# Patient Record
Sex: Male | Born: 1955
Health system: Southern US, Community
[De-identification: ages and names within clinical notes are randomized; demographics above are authoritative.]

## PROBLEM LIST (undated history)

## (undated) DIAGNOSIS — G8929 Other chronic pain: Secondary | ICD-10-CM

## (undated) DIAGNOSIS — I2510 Atherosclerotic heart disease of native coronary artery without angina pectoris: Secondary | ICD-10-CM

## (undated) DIAGNOSIS — G43909 Migraine, unspecified, not intractable, without status migrainosus: Secondary | ICD-10-CM

## (undated) DIAGNOSIS — E119 Type 2 diabetes mellitus without complications: Secondary | ICD-10-CM

## (undated) DIAGNOSIS — I482 Chronic atrial fibrillation, unspecified: Secondary | ICD-10-CM

## (undated) DIAGNOSIS — G4730 Sleep apnea, unspecified: Secondary | ICD-10-CM

## (undated) DIAGNOSIS — D72829 Elevated white blood cell count, unspecified: Secondary | ICD-10-CM

## (undated) DIAGNOSIS — R413 Other amnesia: Secondary | ICD-10-CM

## (undated) DIAGNOSIS — I10 Essential (primary) hypertension: Secondary | ICD-10-CM

## (undated) DIAGNOSIS — F419 Anxiety disorder, unspecified: Secondary | ICD-10-CM

## (undated) DIAGNOSIS — F32A Depression, unspecified: Secondary | ICD-10-CM

## (undated) DIAGNOSIS — I700 Atherosclerosis of aorta: Secondary | ICD-10-CM

## (undated) DIAGNOSIS — F329 Major depressive disorder, single episode, unspecified: Secondary | ICD-10-CM

## (undated) DIAGNOSIS — K449 Diaphragmatic hernia without obstruction or gangrene: Secondary | ICD-10-CM

## (undated) DIAGNOSIS — G629 Polyneuropathy, unspecified: Secondary | ICD-10-CM

## (undated) DIAGNOSIS — K219 Gastro-esophageal reflux disease without esophagitis: Secondary | ICD-10-CM

## (undated) DIAGNOSIS — D126 Benign neoplasm of colon, unspecified: Secondary | ICD-10-CM

## (undated) DIAGNOSIS — I2584 Coronary atherosclerosis due to calcified coronary lesion: Secondary | ICD-10-CM

## (undated) DIAGNOSIS — M549 Dorsalgia, unspecified: Secondary | ICD-10-CM

## (undated) DIAGNOSIS — A048 Other specified bacterial intestinal infections: Secondary | ICD-10-CM

## (undated) HISTORY — DX: Chronic atrial fibrillation, unspecified: I48.20

## (undated) HISTORY — DX: Type 2 diabetes mellitus without complications: E11.9

## (undated) HISTORY — DX: Other chronic pain: G89.29

## (undated) HISTORY — DX: Atherosclerotic heart disease of native coronary artery without angina pectoris: I25.10

## (undated) HISTORY — DX: Depression, unspecified: F32.A

## (undated) HISTORY — DX: Other amnesia: R41.3

## (undated) HISTORY — DX: Gastro-esophageal reflux disease without esophagitis: K21.9

## (undated) HISTORY — DX: Other specified bacterial intestinal infections: A04.8

## (undated) HISTORY — DX: Anxiety disorder, unspecified: F41.9

## (undated) HISTORY — DX: Diaphragmatic hernia without obstruction or gangrene: K44.9

## (undated) HISTORY — DX: Atherosclerosis of aorta: I70.0

## (undated) HISTORY — DX: Elevated white blood cell count, unspecified: D72.829

## (undated) HISTORY — DX: Coronary atherosclerosis due to calcified coronary lesion: I25.84

## (undated) HISTORY — DX: Major depressive disorder, single episode, unspecified: F32.9

## (undated) HISTORY — DX: Essential (primary) hypertension: I10

## (undated) HISTORY — DX: Benign neoplasm of colon, unspecified: D12.6

## (undated) HISTORY — DX: Migraine, unspecified, not intractable, without status migrainosus: G43.909

## (undated) HISTORY — DX: Dorsalgia, unspecified: M54.9

---

## 2004-02-27 HISTORY — PX: UMBILICAL HERNIA REPAIR: SHX196

## 2004-02-27 HISTORY — PX: CHOLECYSTECTOMY: SHX55

## 2004-07-18 ENCOUNTER — Emergency Department (HOSPITAL_COMMUNITY): Admission: EM | Admit: 2004-07-18 | Discharge: 2004-07-19 | Payer: Self-pay | Admitting: Family Medicine

## 2004-07-25 ENCOUNTER — Observation Stay (HOSPITAL_COMMUNITY): Admission: RE | Admit: 2004-07-25 | Discharge: 2004-07-26 | Payer: Self-pay | Admitting: General Surgery

## 2007-03-22 ENCOUNTER — Emergency Department (HOSPITAL_COMMUNITY): Admission: EM | Admit: 2007-03-22 | Discharge: 2007-03-23 | Payer: Self-pay | Admitting: Emergency Medicine

## 2010-04-13 ENCOUNTER — Encounter (INDEPENDENT_AMBULATORY_CARE_PROVIDER_SITE_OTHER): Payer: Self-pay | Admitting: *Deleted

## 2010-04-19 NOTE — Letter (Signed)
Summary: Unable to Reach, Consult Scheduled  [facility]  [address]   [place], [place] [address]   Phone: [phone]  Fax: [phone]    04/13/2010  WESTYN DRIGGERS [address] [place], [place]  [address] 11/23/1955   Dear Mr. Enns,    At the recommendation of DR [facility] we have been asked to schedule you a consult with OUR PRACTICE for CONSULT FOR COLONOSCOPY.  Please call our office at [phone].     Thank you,    Diana Eves  [facility] R. Roetta Sessions, M.D.    Jonette Eva, M.D. Lorenza Burton, FNP-BC    Tana Coast, PA-C Phone: [phone]    Fax: [phone]

## 2010-05-09 ENCOUNTER — Encounter: Payer: Self-pay | Admitting: Gastroenterology

## 2010-05-09 ENCOUNTER — Encounter: Payer: Self-pay | Admitting: Internal Medicine

## 2010-05-09 ENCOUNTER — Ambulatory Visit (INDEPENDENT_AMBULATORY_CARE_PROVIDER_SITE_OTHER): Payer: MEDICARE | Admitting: Gastroenterology

## 2010-05-09 DIAGNOSIS — Z1211 Encounter for screening for malignant neoplasm of colon: Secondary | ICD-10-CM

## 2010-05-09 DIAGNOSIS — R1012 Left upper quadrant pain: Secondary | ICD-10-CM

## 2010-05-15 ENCOUNTER — Ambulatory Visit (HOSPITAL_COMMUNITY)
Admission: RE | Admit: 2010-05-15 | Discharge: 2010-05-15 | Disposition: A | Discharge status: Home / Self Care | Payer: Medicare Other | Source: Ambulatory Visit | Attending: Internal Medicine | Admitting: Internal Medicine

## 2010-05-15 ENCOUNTER — Encounter: Payer: Medicare Other | Admitting: Internal Medicine

## 2010-05-15 ENCOUNTER — Other Ambulatory Visit: Payer: Self-pay | Admitting: Internal Medicine

## 2010-05-15 DIAGNOSIS — D126 Benign neoplasm of colon, unspecified: Secondary | ICD-10-CM

## 2010-05-15 DIAGNOSIS — K921 Melena: Secondary | ICD-10-CM | POA: Insufficient documentation

## 2010-05-15 HISTORY — PX: COLONOSCOPY: SHX174

## 2010-05-16 NOTE — Letter (Signed)
Summary: TCS/EGD/ED ORDER  TCS/EGD/ED ORDER   Imported By: Ave Filter 05/09/2010 09:50:59  _____________________________________________________________________  External Attachment:    Type:   Image     Comment:   External Document

## 2010-05-16 NOTE — Medication Information (Signed)
Summary: OMEPRAZOLE  OMEPRAZOLE   Imported By: Rexene Alberts 05/09/2010 12:22:44  _____________________________________________________________________  External Attachment:    Type:   Image     Comment:   External Document  Appended Document: OMEPRAZOLE addressed at ov today

## 2010-05-16 NOTE — Assessment & Plan Note (Signed)
Summary: CONSULT FOR TCS/HEMORRHOIDS/LAW   Vital Signs:  Patient profile:   55 year old male Height:      68 inches Weight:      214 pounds BMI:     32.66 Temp:     98 degrees F oral Pulse rate:   68 / minute BP sitting:   156 / 108  (left arm)  Vitals Entered By: Carolan Clines LPN (May 09, 2010 8:40 AM)  Visit Type:  Initial Consult Referring Provider:  Dr. Sherwood Gambler Primary Care Provider:  Dr. Sherwood Gambler   History of Present Illness: Garrett Clements is a 55 year old Caucasian male who presents for an initial screening colonoscopy at the request of Dr. Sherwood Gambler. Reports scant amounts of tissue hematochezia. Has a BM twice/day.  Reports LUQ discomfort, sharp, like a sledgehammer X 2-3 years, lasts about 15-20 minutes, not associated with eating/drinking. No aggravating/alleviating factors. Used to take Nexium in the remote past for reflux. Rare episodes of reflux now. No dysphagia/odynophagia. Takes Ibuprofen/Aleve about twice/day for headaches.  Appetite waxes and wanes. No significant wt loss.   Current Medications (verified): 1)  Prilosec 20 Mg Cpdr (Omeprazole) .... Take 1 By Mouth 30 Minutes Before Breakfast 2)  Alprazolam 0.5 Mg Tabs (Alprazolam) .Marland Kitchen.. 1 Tab By Mouth Qid As Needed 3)  Fluorometholone 0.1 % Susp (Fluorometholone) .Marland Kitchen.. 1 Drop Both Eyes Three Times/day X 2 Weeks 4)  Mobic 7.5 Mg Tabs (Meloxicam) .Marland Kitchen.. 1 By Mouth Two Times A Day As Needed  Allergies (verified): No Known Drug Allergies  Past History:  Past Medical History: GERD Chronic migraines Chronic back pain  Past Surgical History: umbilical hernia repair Cholecystectomy   Family History: Mother:living, COPD, glaucoma, DM Father: living, PUD No FH of Colon Cancer:  Social History: Occupation: disability Single, stays with mom,  2 adult children Patient currently smokes. 2-3 ppd X >40 years Alcohol Use - yes, occasionally Illicit Drug Use - no Smoking Status:  current Drug Use:  no  Review of  Systems General:  Denies fever, chills, and anorexia. Eyes:  Denies blurring, irritation, and discharge. ENT:  Denies sore throat, hoarseness, and difficulty swallowing. CV:  Denies chest pains and syncope. Resp:  Denies dyspnea at rest and wheezing. GI:  See HPI. GU:  Denies urinary burning and urinary frequency. MS:  Denies joint pain / LOM, joint swelling, and joint stiffness. Derm:  Denies rash, itching, and dry skin. Neuro:  Denies weakness and syncope. Psych:  Denies depression and anxiety. Endo:  Denies cold intolerance and heat intolerance.  Physical Exam  General:  Well developed, well nourished, no acute distress, yet somewhat anxious regarding upcoming procedure Head:  Normocephalic and atraumatic. Eyes:  sclera without icterus Mouth:  No deformity or lesions, dentition normal. Lungs:  Clear throughout to auscultation. diminished bases Heart:  S1 S2 present Abdomen:  +BS, soft, mildly TTP LUQ, non-distended. no rebound or guarding. No HSM. umbilical hernia scars well-healed.  Msk:  Symmetrical with no gross deformities. Normal posture. Extremities:  No clubbing, cyanosis, edema or deformities noted. Neurologic:  Alert and  oriented x4;  grossly normal neurologically. Skin:  Intact without significant lesions or rashes. Psych:  Alert and cooperative. Normal mood and affect.   Impression & Recommendations:  Problem # 1:  SCREENING COLORECTAL-CANCER (ICD-V21.41)  55 year old Caucasian male with no prior hx of colonoscopy. BM twice daily, scant amount of paper hematochezia noted. Likely due to benign anorectal source. No FH of colon ca. Likelihood of occult malignancy low. Due for screening colonoscopy.  TCS +/- EGD with Dr. Jena Gauss in near future: the R/B/A have been discussed in detail with pt; states understanding and desires to proceed. (EGD may be performed if no improvement with PPI started at today's visit, secondary to LUQ pain, hx of NSAID use. see # 2).    Orders: Consultation Level III ([address])  Problem # 2:  ABDOMINAL PAIN-LUQ (ICD-789.02)  2-3 year hx of intermittent stabbing LUQ pain, no aggravating or alleviating factors. Associated NSAID use, normally daily for chronic headaches. No dysphagia or odynophagia. Pt currently not on PPI. Likely component of gastritis, PUD.    +/- EGD at time of colonoscopy. Prilosec 20 mg by mouth daily, #12 samples given, rx sent to pharmacy  Orders: Consultation Level III (60454)  Patient Instructions: 1)  Take Prilosec 1 capsule 30 minutes before breakfast daily 2)  You will be set up for a procedure with Dr. Jena Gauss to look at your colon. We may need to look at your esophagus/stomach if you continue to have pain in your abdomen 3)  Follow-up with your primary doctor about your blood pressure. 4)  The medication list was reviewed and reconciled.  All changed / newly prescribed medications were explained.  A complete medication list was provided to the patient / caregiver.  Prescriptions: PRILOSEC 20 MG CPDR (OMEPRAZOLE) take 1 by mouth 30 minutes before breakfast  #31 x 3   Entered and Authorized by:   Gerrit Halls NP   Signed by:   Gerrit Halls NP on 05/09/2010   Method used:   Faxed to ...       [facility]  [address] [phone]* (retail)       [address]       [place], [place]  [address]       Ph: [phone]       Fax: [phone]   RxID:   [id number]    Orders Added: 1)  Consultation Level III [36644]

## 2010-05-23 DIAGNOSIS — D126 Benign neoplasm of colon, unspecified: Secondary | ICD-10-CM | POA: Insufficient documentation

## 2010-05-23 HISTORY — DX: Benign neoplasm of colon, unspecified: D12.6

## 2010-05-23 NOTE — Op Note (Signed)
  NAMEMAYJOR, AGER               ACCOUNT NO.:  [id number]  MEDICAL RECORD NO.:  [id number]           PATIENT TYPE:  O  LOCATION:  DAYP                          FACILITY:  [facility]  PHYSICIAN:  R. Roetta Sessions, M.D. DATE OF BIRTH:  21-Dec-1955  DATE OF PROCEDURE:  05/15/2010 DATE OF DISCHARGE:                              OPERATIVE REPORT   Colonoscopy, snare polypectomy, polyp ablation.  INDICATIONS FOR PROCEDURE:  A 55 year old gentleman with a scant paper hematochezia.  Denies constipation, diarrhea.  No prior colon imaging. No family history of colon polyps or colon cancer.  The patient has had some left upper quadrant abdominal pain.  No alarm symptoms.  No dysphagia, bleeding, or melena, etc.  Started on Prilosec 20 mg orally recently which underlying __________ abolished these symptoms.  Plan was to proceed with an EGD and Prilosec was not helpful in alleviating these symptoms.  Since symptoms have resolved from acid suppression therapy, hold off on EGD today.  Colonoscopy alone being done.  Risks, benefits, limitations, alternatives, and imponderables have been discussed, questions answered. Please see the documentation medical record.  PROCEDURE NOTE:  O2 saturation, blood pressure, pulse, respirations were monitored throughout the entire procedure.  CONSCIOUS SEDATION:  Versed 7 mg IV, Demerol 125 mg IV in divided doses.  INSTRUMENT:  Pentax video chip system.  FINDINGS:  Digital rectal exam revealed no abnormalities.  Endoscopic findings:  Prep was adequate.  Colon:  Colonic mucosa was surveyed from the rectosigmoid junction through the left transverse, right colon to the appendiceal orifice, ileocecal valve/cecum.  These structures were well seen and photographed for the record.  From this level, the scope was slowly and cautiously withdrawn.  Previously mentioned mucosal surfaces were again seen.  The patient had a 1-cm pedunculated polyp in the midsigmoid at 30  cm and adjacent diminutive polyp.  The diminutive polyp was ablated with the tip of hot snare cautery unit.  The larger polyp resected with snare cautery and recovered through the scope. Remainder of the colonic mucosa appeared normal.  Scope was pulled down the rectum where a thorough examination of the rectal mucosa including retroflexed view of the anal verge, en face view of the anal canal demonstrated friable anal canal.  The patient tolerated the procedure well.  Cecal withdrawal time 9 minutes.  IMPRESSION: 1. Friable anal canal, otherwise normal rectum. 2. Polyps and sigmoid colon resected as described above.  The colonic     mucosa appeared normal.  Suspect more benign anorectal bleeding, although the larger of the two polyps may have bled a little recently to the point of contributing to symptoms.  RECOMMENDATIONS: 1. Polyp literature provided to Mr. Maniaci. 2. Follow up on path. 3. A 10-day course of Anusol HC suppositories 1 per rectum at bedtime. 4. Further recommendations to follow.     Jonathon Bellows, M.D.     RMR/MEDQ  D:  05/15/2010  T:  05/15/2010  Job:  [id number]  cc:   Madelin Rear. Sherwood Gambler, MD Fax: [phone]  Electronically Signed by Lorrin Goodell M.D. on 05/23/2010 11:22:58 AM

## 2010-07-14 NOTE — H&P (Signed)
NAMECHEVIS, Garrett Clements NO.:  [id number]   MEDICAL RECORD NO.:  [id number]          PATIENT TYPE:  AMB   LOCATION:  DAY                           FACILITY:  [facility]   PHYSICIAN:  Jerolyn Shin C. Katrinka Blazing, M.D.   DATE OF BIRTH:  Mar 16, 1955   DATE OF ADMISSION:  DATE OF DISCHARGE:  LH                                HISTORY & PHYSICAL   HISTORY OF PRESENT ILLNESS:  A 55 year old male with a history of acute  onset of severe abdominal pain around May 24th.  The pain became recurrent.  He had episodes of nausea with vomiting.  His pain radiated through to his  back.  He has had recurrent episodes.  Ultrasound reveals multiple  gallstones.  Patient is scheduled for cholecystectomy.  The patient also has  an incarcerated umbilical hernia and will have umbilical hernia repair.   PAST MEDICAL HISTORY:  1.  Lumbar disk disease, which is inoperable.  2.  Hypertension, which has just been started on treatment.   MEDICATIONS:  Lotrel 5/10 daily.   ALLERGIES:  None.   PAST SURGICAL HISTORY:  None.   SOCIAL HISTORY:  He is disabled.  He smokes two packs of cigarettes per day.  He does not drink or use drugs.   PHYSICAL EXAMINATION:  VITAL SIGNS:  Blood pressure 152/96, pulse 60,  respirations 20.  HEENT:  Unremarkable.  NECK:  Supple.  No JVD, bruit, adenopathy, or thyromegaly.  CHEST:  Clear to auscultation.  HEART:  Regular rate and rhythm without murmur, gallop, or rub.  ABDOMEN:  Moderate right upper quadrant and epigastric tenderness with  guarding.  Normoactive bowel sounds.  Large incarcerated umbilical hernia  with suspected incarcerated omentum.  EXTREMITIES:  No cyanosis, clubbing, or edema.  NEUROLOGIC:  No focal motor, sensory, or cerebellar deficit.   IMPRESSION:  1.  Cholecystitis and cholelithiasis.  2.  Incarcerated umbilical hernia.  3.  Hypertension.   PLAN:  Laparoscopic cholecystectomy and umbilical hernia repair.       LCS/MEDQ  D:  07/24/2004  T:   07/24/2004  Job:  [id number]

## 2010-07-14 NOTE — Op Note (Signed)
Garrett Clements, Garrett Clements NO.:  [id number]   MEDICAL RECORD NO.:  [id number]          PATIENT TYPE:  AMB   LOCATION:  DAY                           FACILITY:  [facility]   PHYSICIAN:  Jerolyn Shin C. Katrinka Blazing, M.D.   DATE OF BIRTH:  1955/04/17   DATE OF PROCEDURE:  07/25/2004  DATE OF DISCHARGE:                                 OPERATIVE REPORT   PREOPERATIVE DIAGNOSES:  1.  Cholelithiasis.  2.  Cholecystitis  3.  Incarcerated umbilical hernia.   POSTOPERATIVE DIAGNOSIS:  1.  Cholelithiasis.  2.  Cholecystitis  3.  Incarcerated umbilical hernia.   PROCEDURE:  1.  Laparoscopic cholecystectomy.  2.  Umbilical hernia repair.   SURGEON:  Dirk Dress. Katrinka Blazing, M.D.   DESCRIPTION OF PROCEDURE:  Under general anesthesia, the patient's abdomen  was prepped and draped in a sterile field.  A curvilinear supraumbilical  incision was made, and the Veress needle was inserted uneventfully.  The  abdomen was insufflated with 5.2 liters of CO2.  Using a Visiport guide, a  10-mm Venaport was placed uneventfully.  The laparoscope was placed.  The  patient was placed in the reverse Trendelenburg position.  The gallbladder  was visualized.  Under videoscopic guidance, a 10-mm port and two 5-mm ports  were placed in the right subcostal region.  The gallbladder was grasped in  mid position.  The cystic duct was clipped with bi-clips and divided.  The  cystic artery was dissected, clipped with 3 clips and divided.  The  gallbladder was then separated from the hepatic bed using electrocautery.  There was no bowel leak and no bleeding.  Irrigation was carried out.  The  gallbladder was retrieved intact.  CO2 was allowed to escape from the  abdomen, and the ports were removed.  Tension was then turned to the  umbilicus.  The supraumbilical curvilinear incision was extended.  The sac  was dissected and separated from the overlying skin.  It was evaginated, and  a medium plug was placed.  The plug was  sutured with multiple interrupted  sutures of 0 Prolene.  A small overlay patch was placed and sutured wit 0  Prolene.  Subcutaneous tissue was then closed over the mesh.  The skin was  then sutured down to the subcutaneous tissue  with Biosyn.  Skin was closed with 4-0 Vicryl.  Dressings were placed.  The  patient tolerated the procedure well.  He was awakened from anesthesia  uneventfully and transferred to a bed and taken to the postanesthesia care  unit for further monitoring.       LCS/MEDQ  D:  07/25/2004  T:  07/25/2004  Job:  [id number]   cc:   Kirk Ruths, M.D.  [address]  [place]  [place] [address]  Fax: [phone]

## 2011-06-28 ENCOUNTER — Encounter: Payer: Self-pay | Admitting: Internal Medicine

## 2011-06-29 ENCOUNTER — Ambulatory Visit (INDEPENDENT_AMBULATORY_CARE_PROVIDER_SITE_OTHER): Payer: Medicare Other | Admitting: Urgent Care

## 2011-06-29 ENCOUNTER — Encounter: Payer: Self-pay | Admitting: Urgent Care

## 2011-06-29 VITALS — BP 154/85 | HR 65 | Temp 97.9°F | Ht 68.0 in | Wt 214.8 lb

## 2011-06-29 DIAGNOSIS — K219 Gastro-esophageal reflux disease without esophagitis: Secondary | ICD-10-CM | POA: Insufficient documentation

## 2011-06-29 DIAGNOSIS — D126 Benign neoplasm of colon, unspecified: Secondary | ICD-10-CM

## 2011-06-29 DIAGNOSIS — R195 Other fecal abnormalities: Secondary | ICD-10-CM | POA: Insufficient documentation

## 2011-06-29 NOTE — Patient Instructions (Signed)
You need EGD with Dr Jena Gauss 1-800-QUIT-NOW for help quitting smoking Continue prilosec 20mg  daily

## 2011-06-29 NOTE — Assessment & Plan Note (Addendum)
Garrett Clements is a pleasant 56 y.o. male found to have hemoccult positive stool on exam without evidence of anemia on recent labs. This makes it unlikely that he is having significant volume GI bleeding.  He has known friable anal canal which is most likely cause of his heme positive stool & scant hematochezia in the setting of Aspirin & NSAIDS.  Current symptoms include upper GI tract.  Will undergo EGD for further evaluation to look for PUD, erosive esophagitis/gastritis, or h pylori which could also cause heme positive stools.  He is up to date on colonoscopy which was done 1 year ago.

## 2011-06-29 NOTE — Progress Notes (Signed)
Primary Care Physician:  FUSCO,LAWRENCE J., MD, MD Primary Gastroenterologist:  Dr. Rourk  Chief Complaint  Patient presents with  . Abdominal Pain  . Rectal Bleeding    Heme + stool  . Heartburn    HPI:  Garrett Clements is a 56 y.o. male referred back to us for abdominal pain, hemoccult positive stool & possible EGD.  He had colonoscopy last year for hematochezia & was found to have friable anal canal & 1cm tubulovillous adenoma removed from mid-sigmoid colon, as well as another diminutive polyp.  Since colonoscopy, he has had some loose stools 3-4 times per day.  Heme + stool on exam at PCP.  Pt has also seen continued light pink blood w/ wiping.  C/o intermittent upper to mid abdominal pain that lasts 15-20 minutes, feels like gas pressure, Pain 5/10.  C/o nausea w/ pain.  Constant heartburn & indigestion on prilosec 20mg daily x 3-4 yrs. C/o dysphagia w/ occasional regurgitation of undigested foods within a few seconds to minutes after eating.  Denies odynophagia or dysphagia.  Wt stable.  C/o waxing & waning appetite.  Baby ASA daily.  On Mobic x 7 years.  Last labs [facility] sent from 06/2010 reviewed.  Past Medical History  Diagnosis Date  . GERD (gastroesophageal reflux disease)   . Migraines     Chronic  . Chronic back pain   . Memory deficits     per brother, pt has no long term memory? cause  . Tubulovillous adenoma of colon 05/23/10  . Anxiety   . Depression   . Diabetes mellitus     Past Surgical History  Procedure Date  . Umbilical hernia repair 2006  . Cholecystectomy 2006  . Colonoscopy 05/15/10    Rourk-friable anal canal, 1cm pedunculated TVA  mid-sigmoid , diminutive adjacent polyp ablated    Current Outpatient Prescriptions  Medication Sig Dispense Refill  . ALPRAZolam (XANAX) 0.5 MG tablet Take 0.5 mg by mouth at bedtime as needed.        . enalapril (VASOTEC) 5 MG tablet Take 5 mg by mouth daily.       . escitalopram (LEXAPRO) 10 MG tablet Take 10 mg by mouth  daily.       . meloxicam (MOBIC) 7.5 MG tablet Take 7.5 mg by mouth daily.        . omeprazole (PRILOSEC) 20 MG capsule Take 20 mg by mouth daily.          Allergies as of 06/29/2011  . (No Known Allergies)    Family History:There is no known family history of colorectal carcinoma , liver disease, or inflammatory bowel disease.  Problem Relation Age of Onset  . Diabetes Mother   . Diabetes Brother     History   Social History  . Marital Status: Legally Separated    Spouse Name: N/A    Number of Children: 2  . Years of Education: N/A   Occupational History  . disabled    Social History Main Topics  . Smoking status: Current Everyday Smoker -- 1.5 packs/day for 40 years    Types: Cigarettes  . Smokeless tobacco: Not on file  . Alcohol Use: Yes     1-2 a year  . Drug Use: No  . Sexually Active: Not on file   Other Topics Concern  . Not on file   Social History Narrative   Lives w/ mother   Review of Systems: Gen: Denies any fever, chills, sweats, anorexia, fatigue, weakness, malaise, weight   loss, and sleep disorder CV: Denies chest pain, angina, palpitations, syncope, orthopnea, PND, peripheral edema, and claudication. Resp: Denies dyspnea at rest, dyspnea with exercise, cough, sputum, wheezing, coughing up blood, and pleurisy. GI: Denies vomiting blood, jaundice, and fecal incontinence.   GU : Denies urinary burning, blood in urine, urinary frequency, urinary hesitancy, nocturnal urination, and urinary incontinence. MS: Denies joint pain, limitation of movement, and swelling, stiffness, low back pain, extremity pain. Denies muscle weakness, cramps, atrophy.  Derm: Denies rash, itching, dry skin, hives, moles, warts, or unhealing ulcers.  Psych: Denies depression, anxiety, memory loss, suicidal ideation, hallucinations, paranoia, and confusion. Heme: Denies bruising, bleeding, and enlarged lymph nodes. Neuro:  Denies any headaches, dizziness, paresthesias. Endo:   Denies any problems with thyroid, adrenal function.  Physical Exam: BP 154/85  Pulse 65  Temp(Src) 97.9 F (36.6 C) (Temporal)  Ht 5' 8" (1.727 m)  Wt 214 lb 12.8 oz (97.433 kg)  BMI 32.66 kg/m2 General:   Alert,  Well-developed, well-nourished, pleasant and cooperative in NAD.  Accompanied by his brother. Head:  Normocephalic and atraumatic. Eyes:  Sclera clear, no icterus.   Conjunctiva pink. Ears:  Normal auditory acuity. Nose:  No deformity, discharge, or lesions. Mouth:  No deformity or lesions,oropharynx pink & moist. Neck:  Supple; no masses or thyromegaly. Lungs:  Clear throughout to auscultation.   No wheezes, crackles, or rhonchi. No acute distress. Heart:  Regular rate and rhythm; no murmurs, clicks, rubs,  or gallops. Abdomen:  Normal bowel sounds.  No bruits.  Soft, non-tender and non-distended without masses, hepatosplenomegaly or hernias noted.  No guarding or rebound tenderness.   Rectal:  Deferred. Msk:  Symmetrical without gross deformities. Normal posture. Pulses:  Normal pulses noted. Extremities:  No clubbing or edema. Neurologic:  Alert and  oriented x4;  grossly normal neurologically. Skin:  Intact without significant lesions or rashes. Lymph Nodes:  No significant cervical adenopathy. Psych:  Alert and cooperative. Normal mood and affect.   

## 2011-06-29 NOTE — Assessment & Plan Note (Addendum)
4 year history of chronic GERD despite omeprazole 20mg  daily.  Daily ASA & NSAID use.  Regurgitation of undigested foods & chronic bloating.  EGD to evaluate as above w/ Dr Jena Gauss.  I have discussed risks & benefits which include, but are not limited to, bleeding, infection, perforation & drug reaction.  The patient agrees with this plan & written consent will be obtained.

## 2011-07-02 NOTE — Progress Notes (Signed)
Faxed to PCP

## 2011-07-19 ENCOUNTER — Encounter (HOSPITAL_COMMUNITY): Payer: Self-pay | Admitting: Pharmacy Technician

## 2011-07-19 ENCOUNTER — Telehealth: Payer: Self-pay | Admitting: Gastroenterology

## 2011-07-19 MED ORDER — SODIUM CHLORIDE 0.45 % IV SOLN
Freq: Once | INTRAVENOUS | Status: AC
  Administered 2011-07-20: 20 mL/h via INTRAVENOUS

## 2011-07-19 NOTE — Telephone Encounter (Signed)
Procedure time changed to 1pm- LMOVM with new arrival of 12pm

## 2011-07-20 ENCOUNTER — Ambulatory Visit (HOSPITAL_COMMUNITY)
Admission: RE | Admit: 2011-07-20 | Discharge: 2011-07-20 | Disposition: A | Discharge status: Home / Self Care | Payer: Medicare Other | Source: Ambulatory Visit | Attending: Internal Medicine | Admitting: Internal Medicine

## 2011-07-20 ENCOUNTER — Encounter (HOSPITAL_COMMUNITY)
Admission: RE | Disposition: A | Discharge status: Home / Self Care | Payer: Self-pay | Source: Ambulatory Visit | Attending: Internal Medicine

## 2011-07-20 ENCOUNTER — Encounter (HOSPITAL_COMMUNITY): Payer: Self-pay | Admitting: *Deleted

## 2011-07-20 DIAGNOSIS — R933 Abnormal findings on diagnostic imaging of other parts of digestive tract: Secondary | ICD-10-CM

## 2011-07-20 DIAGNOSIS — A048 Other specified bacterial intestinal infections: Secondary | ICD-10-CM | POA: Insufficient documentation

## 2011-07-20 DIAGNOSIS — E119 Type 2 diabetes mellitus without complications: Secondary | ICD-10-CM | POA: Insufficient documentation

## 2011-07-20 DIAGNOSIS — K219 Gastro-esophageal reflux disease without esophagitis: Secondary | ICD-10-CM

## 2011-07-20 DIAGNOSIS — K449 Diaphragmatic hernia without obstruction or gangrene: Secondary | ICD-10-CM

## 2011-07-20 DIAGNOSIS — R195 Other fecal abnormalities: Secondary | ICD-10-CM

## 2011-07-20 DIAGNOSIS — R1310 Dysphagia, unspecified: Secondary | ICD-10-CM | POA: Insufficient documentation

## 2011-07-20 DIAGNOSIS — K2940 Chronic atrophic gastritis without bleeding: Secondary | ICD-10-CM | POA: Insufficient documentation

## 2011-07-20 HISTORY — PX: ESOPHAGOGASTRODUODENOSCOPY: SHX5428

## 2011-07-20 SURGERY — EGD (ESOPHAGOGASTRODUODENOSCOPY)
Anesthesia: Moderate Sedation

## 2011-07-20 MED ORDER — MIDAZOLAM HCL 5 MG/5ML IJ SOLN
INTRAMUSCULAR | Status: DC | PRN
  Administered 2011-07-20: 1 mg via INTRAVENOUS
  Administered 2011-07-20 (×2): 2 mg via INTRAVENOUS

## 2011-07-20 MED ORDER — MEPERIDINE HCL 100 MG/ML IJ SOLN
INTRAMUSCULAR | Status: DC | PRN
  Administered 2011-07-20: 50 mg via INTRAVENOUS
  Administered 2011-07-20: 25 mg via INTRAVENOUS

## 2011-07-20 MED ORDER — MEPERIDINE HCL 100 MG/ML IJ SOLN
INTRAMUSCULAR | Status: AC
  Filled 2011-07-20: qty 1

## 2011-07-20 MED ORDER — STERILE WATER FOR IRRIGATION IR SOLN
Status: DC | PRN
  Administered 2011-07-20: 14:00:00

## 2011-07-20 MED ORDER — MIDAZOLAM HCL 5 MG/5ML IJ SOLN
INTRAMUSCULAR | Status: AC
  Filled 2011-07-20: qty 10

## 2011-07-20 MED ORDER — BUTAMBEN-TETRACAINE-BENZOCAINE 2-2-14 % EX AERO
INHALATION_SPRAY | CUTANEOUS | Status: DC | PRN
  Administered 2011-07-20: 2 via TOPICAL

## 2011-07-20 NOTE — Op Note (Signed)
[facility] [address] [place], [place]  [address]  ENDOSCOPY PROCEDURE REPORT  PATIENT:  Garrett Clements, Garrett Clements  MR#:  [id number] BIRTHDATE:  06/22/1955, 56 yrs. old  GENDER:  male  ENDOSCOPIST:  R. Roetta Sessions, MD Caleen Essex Referred by:  Artis Delay, M.D.  PROCEDURE DATE:  07/20/2011 PROCEDURE:  EGD with Elease Hashimoto dilation, gastric biopsy INDICATIONS:   dyspepsia/esophagitis/esophageal dysphagia. GERD  INFORMED CONSENT:   The risks, benefits, limitations, alternatives and imponderables have been discussed.  The potential for biopsy, esophogeal dilation, etc. have also been reviewed.  Questions have been answered.  All parties agreeable.  Please see the history and physical in the medical record for more information.  MEDICATIONS: Versed 5 mg IV and Demerol 75 mg IV in divided doses. Cetacaine spray.  DESCRIPTION OF PROCEDURE:   The EG-[id number] ([id number]) endoscope was introduced through the mouth and advanced to the second portion of the duodenum without difficulty or limitations.  The mucosal surfaces were surveyed very carefully during advancement of the scope and upon withdrawal.  Retroflexion view of the proximal stomach and esophagogastric junction was performed.  <<PROCEDUREIMAGES>>  FINDINGS: Normal, patent tubular esophagus. Stomach empty. Small hernia. Diffuse mottling patchy erythema of the gastric mucosa; no infiltrating process or ulcer  seen. Pylorus patent Examination of the first and second portion of duodenum revealed no abnormalities. abnormalities  THERAPEUTIC / DIAGNOSTIC MANEUVERS PERFORMED:  A 56 French Maloney dilator was passed to full insertion. This was done easily. A look back revealed no apparent complication related to passage of the dilator. Subsequently, biopsies of gastric antrum and body were taken histologic study.  COMPLICATIONS:   None  IMPRESSION:  Normal esophagus - status post Maloney dilation. Small hiatal hernia.  Abnormal  gastric mucosa of uncertain significance - status post biopsy.  RECOMMENDATIONS:  Stop Mobic for now. Continue omeprazole 40 mg orally daily. Followup on pathology. As a separate issue, if low-volume rectal bleeding does not resove, may need reevaluation of the rectum and distal colon in the near future. Patient is urged to followup with [place] in 4 weeks so we can decide about further evaluation/management.  ______________________________ R. Roetta Sessions, MD Caleen Essex  CC:  n. eSIGNED:   R. Roetta Sessions at 07/20/2011 02:56 PM  Bronson Curb, [phone]

## 2011-07-20 NOTE — H&P (View-Only) (Signed)
Primary Care Physician:  Cassell Smiles., MD, MD Primary Gastroenterologist:  Dr. Jena Gauss  Chief Complaint  Patient presents with  . Abdominal Pain  . Rectal Bleeding    Heme + stool  . Heartburn    HPI:  Garrett Clements is a 56 y.o. male referred back to [place] for abdominal pain, hemoccult positive stool & possible EGD.  He had colonoscopy last year for hematochezia & was found to have friable anal canal & 1cm tubulovillous adenoma removed from mid-sigmoid colon, as well as another diminutive polyp.  Since colonoscopy, he has had some loose stools 3-4 times per day.  Heme + stool on exam at PCP.  Pt has also seen continued light pink blood w/ wiping.  C/o intermittent upper to mid abdominal pain that lasts 15-20 minutes, feels like gas pressure, Pain 5/10.  C/o nausea w/ pain.  Constant heartburn & indigestion on prilosec 20mg  daily x 3-4 yrs. C/o dysphagia w/ occasional regurgitation of undigested foods within a few seconds to minutes after eating.  Denies odynophagia or dysphagia.  Wt stable.  C/o waxing & waning appetite.  Baby ASA daily.  On Mobic x 7 years.  Last labs [facility] sent from 06/2010 reviewed.  Past Medical History  Diagnosis Date  . GERD (gastroesophageal reflux disease)   . Migraines     Chronic  . Chronic back pain   . Memory deficits     per brother, pt has no long term memory? cause  . Tubulovillous adenoma of colon 05/23/10  . Anxiety   . Depression   . Diabetes mellitus     Past Surgical History  Procedure Date  . Umbilical hernia repair 2006  . Cholecystectomy 2006  . Colonoscopy 05/15/10    Rourk-friable anal canal, 1cm pedunculated TVA  mid-sigmoid , diminutive adjacent polyp ablated    Current Outpatient Prescriptions  Medication Sig Dispense Refill  . ALPRAZolam (XANAX) 0.5 MG tablet Take 0.5 mg by mouth at bedtime as needed.        . enalapril (VASOTEC) 5 MG tablet Take 5 mg by mouth daily.       Marland Kitchen escitalopram (LEXAPRO) 10 MG tablet Take 10 mg by mouth  daily.       . meloxicam (MOBIC) 7.5 MG tablet Take 7.5 mg by mouth daily.        Marland Kitchen omeprazole (PRILOSEC) 20 MG capsule Take 20 mg by mouth daily.          Allergies as of 06/29/2011  . (No Known Allergies)    Family History:There is no known family history of colorectal carcinoma , liver disease, or inflammatory bowel disease.  Problem Relation Age of Onset  . Diabetes Mother   . Diabetes Brother     History   Social History  . Marital Status: Legally Separated    Spouse Name: N/A    Number of Children: 2  . Years of Education: N/A   Occupational History  . disabled    Social History Main Topics  . Smoking status: Current Everyday Smoker -- 1.5 packs/day for 40 years    Types: Cigarettes  . Smokeless tobacco: Not on file  . Alcohol Use: Yes     1-2 a year  . Drug Use: No  . Sexually Active: Not on file   Other Topics Concern  . Not on file   Social History Narrative   Lives w/ mother   Review of Systems: Gen: Denies any fever, chills, sweats, anorexia, fatigue, weakness, malaise, weight  loss, and sleep disorder CV: Denies chest pain, angina, palpitations, syncope, orthopnea, PND, peripheral edema, and claudication. Resp: Denies dyspnea at rest, dyspnea with exercise, cough, sputum, wheezing, coughing up blood, and pleurisy. GI: Denies vomiting blood, jaundice, and fecal incontinence.   GU : Denies urinary burning, blood in urine, urinary frequency, urinary hesitancy, nocturnal urination, and urinary incontinence. MS: Denies joint pain, limitation of movement, and swelling, stiffness, low back pain, extremity pain. Denies muscle weakness, cramps, atrophy.  Derm: Denies rash, itching, dry skin, hives, moles, warts, or unhealing ulcers.  Psych: Denies depression, anxiety, memory loss, suicidal ideation, hallucinations, paranoia, and confusion. Heme: Denies bruising, bleeding, and enlarged lymph nodes. Neuro:  Denies any headaches, dizziness, paresthesias. Endo:   Denies any problems with thyroid, adrenal function.  Physical Exam: BP 154/85  Pulse 65  Temp(Src) 97.9 F (36.6 C) (Temporal)  Ht 5\' 8"  (1.727 m)  Wt 214 lb 12.8 oz (97.433 kg)  BMI 32.66 kg/m2 General:   Alert,  Well-developed, well-nourished, pleasant and cooperative in NAD.  Accompanied by his brother. Head:  Normocephalic and atraumatic. Eyes:  Sclera clear, no icterus.   Conjunctiva pink. Ears:  Normal auditory acuity. Nose:  No deformity, discharge, or lesions. Mouth:  No deformity or lesions,oropharynx pink & moist. Neck:  Supple; no masses or thyromegaly. Lungs:  Clear throughout to auscultation.   No wheezes, crackles, or rhonchi. No acute distress. Heart:  Regular rate and rhythm; no murmurs, clicks, rubs,  or gallops. Abdomen:  Normal bowel sounds.  No bruits.  Soft, non-tender and non-distended without masses, hepatosplenomegaly or hernias noted.  No guarding or rebound tenderness.   Rectal:  Deferred. Msk:  Symmetrical without gross deformities. Normal posture. Pulses:  Normal pulses noted. Extremities:  No clubbing or edema. Neurologic:  Alert and  oriented x4;  grossly normal neurologically. Skin:  Intact without significant lesions or rashes. Lymph Nodes:  No significant cervical adenopathy. Psych:  Alert and cooperative. Normal mood and affect.

## 2011-07-20 NOTE — Discharge Instructions (Addendum)
EGD Discharge instructions Please read the instructions outlined below and refer to this sheet in the next few weeks. These discharge instructions provide you with general information on caring for yourself after you leave the hospital. Your doctor may also give you specific instructions. While your treatment has been planned according to the most current medical practices available, unavoidable complications occasionally occur. If you have any problems or questions after discharge, please call your doctor. ACTIVITY  You may resume your regular activity but move at a slower pace for the next 24 hours.   Take frequent rest periods for the next 24 hours.   Walking will help expel (get rid of) the air and reduce the bloated feeling in your abdomen.   No driving for 24 hours (because of the anesthesia (medicine) used during the test).   You may shower.   Do not sign any important legal documents or operate any machinery for 24 hours (because of the anesthesia used during the test).  NUTRITION  Drink plenty of fluids.   You may resume your normal diet.   Begin with a light meal and progress to your normal diet.   Avoid alcoholic beverages for 24 hours or as instructed by your caregiver.  MEDICATIONS  You may resume your normal medications unless your caregiver tells you otherwise.  WHAT YOU CAN EXPECT TODAY  You may experience abdominal discomfort such as a feeling of fullness or "gas" pains.  FOLLOW-UP  Your doctor will discuss the results of your test with you.  SEEK IMMEDIATE MEDICAL ATTENTION IF ANY OF THE FOLLOWING OCCUR:  Excessive nausea (feeling sick to your stomach) and/or vomiting.   Severe abdominal pain and distention (swelling).   Trouble swallowing.   Temperature over 101 F (37.8 C).   Rectal bleeding or vomiting of blood.    Continue omeprazole 40 mg orally daily. Stop taking Mobic for now.  Further recommendations to follow pending review of pathology  report.  If you continue to have rectal bleeding, we will need to evaluate your rectum and distal colon in the near future. You should return to our office for a followup visit in one month to reassess your condition and decide about the next step in your management.

## 2011-07-20 NOTE — Interval H&P Note (Signed)
History and Physical Interval Note:  07/20/2011 1:56 PM  Garrett Clements  has presented today for surgery, with the diagnosis of GERD  The various methods of treatment have been discussed with the patient and family. After consideration of risks, benefits and other options for treatment, the patient has consented to  Procedure(s) (LRB): ESOPHAGOGASTRODUODENOSCOPY (EGD) (N/A) as a surgical intervention .  The patients' history has been reviewed, patient examined, no change in status, stable for surgery.  I have reviewed the patients' chart and labs.  Questions were answered to the patient's satisfaction.     Eula Listen

## 2011-07-24 ENCOUNTER — Encounter: Payer: Self-pay | Admitting: Internal Medicine

## 2011-07-25 ENCOUNTER — Encounter (HOSPITAL_COMMUNITY): Payer: Self-pay | Admitting: Internal Medicine

## 2011-07-27 ENCOUNTER — Telehealth: Payer: Self-pay | Admitting: Urgent Care

## 2011-07-27 MED ORDER — AMOXICILL-CLARITHRO-LANSOPRAZ PO MISC: Freq: Two times a day (BID) | ORAL | Status: DC

## 2011-07-27 NOTE — Progress Notes (Signed)
Quick Note:  H. pylori is positive. Please let patient know he needs to be treated for the bacteria that causes stomach ulcers. Find out which pharmacy he would like it sent to. Thanks CC: Cassell Smiles., MD  ______

## 2011-07-27 NOTE — Telephone Encounter (Signed)
Pt should hold prilosec while on prevpac Kathlene November, caregiver, aware

## 2011-07-27 NOTE — Telephone Encounter (Signed)
Message copied by Joselyn Arrow on Fri Jul 27, 2011 12:44 PM ------      Message from: Myra Rude      Created: Fri Jul 27, 2011 11:52 AM       Pt aware, [facility] uses [facility], please cc pcp

## 2011-07-28 ENCOUNTER — Encounter: Payer: Self-pay | Admitting: Internal Medicine

## 2011-07-28 DIAGNOSIS — A048 Other specified bacterial intestinal infections: Secondary | ICD-10-CM

## 2011-07-28 HISTORY — DX: Other specified bacterial intestinal infections: A04.8

## 2011-07-30 NOTE — Progress Notes (Signed)
Results Cc to PCP  

## 2011-08-01 ENCOUNTER — Telehealth: Payer: Self-pay

## 2011-08-01 MED ORDER — OMEPRAZOLE 20 MG PO CPDR: 20.0000 mg | DELAYED_RELEASE_CAPSULE | Freq: Two times a day (BID) | ORAL | Status: AC

## 2011-08-01 MED ORDER — BIS SUBCIT-METRONID-TETRACYC 140-125-125 MG PO CAPS: 3.0000 | ORAL_CAPSULE | Freq: Three times a day (TID) | ORAL | Status: DC

## 2011-08-01 NOTE — Telephone Encounter (Signed)
pts brother came by office- prevpac is 600.00, they need something cheaper. I checked pt's formulary- could not find any hpylori meds on it. Tried to call insurance customer service- they couldn't tell me anything because I dont have the pts rx insurance member ID number. Tried to call pharmacy to get ID number- had to leave message for them to call me back. I asked them to call me back with ID number or if they know what hpylori meds the insurance will pay for. Pharmacy has not called back at this time.

## 2011-08-01 NOTE — Telephone Encounter (Signed)
We have free vouchers for pylera. They need rx sent to pharmacy for 2 days worth with 5 refills.

## 2011-08-01 NOTE — Telephone Encounter (Signed)
We can call Pylera rep to see if we can get samples.

## 2011-08-01 NOTE — Telephone Encounter (Signed)
Spoke with pharmacy, they are aware that pt will be bringing in vouchers for this rx. Still waiting for pt to pick up vouchers to tell him about prilosec bid.

## 2011-08-01 NOTE — Telephone Encounter (Signed)
Pts brother is aware. Samples of prilsec given #10 for bid dosing.

## 2011-08-01 NOTE — Telephone Encounter (Signed)
Spoke with pts brother- he is aware to come by and pick up vouchers.

## 2011-08-01 NOTE — Telephone Encounter (Signed)
Patient should change his omeprazole to 20mg  twice daily for 10 days while taking Pylera. I sent new Rx.

## 2011-08-21 ENCOUNTER — Ambulatory Visit: Payer: Medicare Other | Admitting: Internal Medicine

## 2011-08-21 ENCOUNTER — Telehealth: Payer: Self-pay | Admitting: Internal Medicine

## 2011-08-21 NOTE — Telephone Encounter (Signed)
Pt was a no show

## 2011-09-14 NOTE — Telephone Encounter (Signed)
Open in error

## 2014-04-13 DIAGNOSIS — Z6831 Body mass index (BMI) 31.0-31.9, adult: Secondary | ICD-10-CM | POA: Diagnosis not present

## 2014-04-13 DIAGNOSIS — E1165 Type 2 diabetes mellitus with hyperglycemia: Secondary | ICD-10-CM | POA: Diagnosis not present

## 2014-04-13 DIAGNOSIS — Z719 Counseling, unspecified: Secondary | ICD-10-CM | POA: Diagnosis not present

## 2014-04-13 DIAGNOSIS — E6609 Other obesity due to excess calories: Secondary | ICD-10-CM | POA: Diagnosis not present

## 2014-04-13 DIAGNOSIS — I4891 Unspecified atrial fibrillation: Secondary | ICD-10-CM | POA: Diagnosis not present

## 2014-04-19 ENCOUNTER — Encounter: Payer: Self-pay | Admitting: *Deleted

## 2014-04-19 ENCOUNTER — Encounter: Payer: Self-pay | Admitting: Cardiology

## 2014-04-19 ENCOUNTER — Ambulatory Visit (INDEPENDENT_AMBULATORY_CARE_PROVIDER_SITE_OTHER): Payer: PRIVATE HEALTH INSURANCE | Admitting: Cardiology

## 2014-04-19 VITALS — BP 128/88 | HR 91 | Ht 68.0 in | Wt 207.0 lb

## 2014-04-19 DIAGNOSIS — I4819 Other persistent atrial fibrillation: Secondary | ICD-10-CM

## 2014-04-19 DIAGNOSIS — I10 Essential (primary) hypertension: Secondary | ICD-10-CM | POA: Insufficient documentation

## 2014-04-19 DIAGNOSIS — Z720 Tobacco use: Secondary | ICD-10-CM | POA: Diagnosis not present

## 2014-04-19 DIAGNOSIS — I2089 Other forms of angina pectoris: Secondary | ICD-10-CM

## 2014-04-19 DIAGNOSIS — E119 Type 2 diabetes mellitus without complications: Secondary | ICD-10-CM | POA: Diagnosis not present

## 2014-04-19 DIAGNOSIS — I481 Persistent atrial fibrillation: Secondary | ICD-10-CM | POA: Diagnosis not present

## 2014-04-19 DIAGNOSIS — I208 Other forms of angina pectoris: Secondary | ICD-10-CM

## 2014-04-19 NOTE — Patient Instructions (Signed)
Your physician has requested that you have an echocardiogram. Echocardiography is a painless test that uses sound waves to create images of your heart. It provides your doctor with information about the size and shape of your heart and how well your heart's chambers and valves are working. This procedure takes approximately one hour. There are no restrictions for this procedure. Your physician has recommended that you wear a 24 HOUR holter monitor. Holter monitors are medical devices that record the heart's electrical activity. Doctors most often use these monitors to diagnose arrhythmias. Arrhythmias are problems with the speed or rhythm of the heartbeat. The monitor is a small, portable device. You can wear one while you do your normal daily activities. This is usually used to diagnose what is causing palpitations/syncope (passing out). Office will contact with results via phone or letter.   Continue all current medications. Follow up in  4-6 weeks

## 2014-04-19 NOTE — Addendum Note (Signed)
Addended by: Laurine Blazer on: 04/19/2014 11:56 AM   Modules accepted: Orders

## 2014-04-19 NOTE — Progress Notes (Signed)
Cardiology Office Note  Date: 04/19/2014   ID: Brodi, Kari [birth year], MRN [id number]  PCP: Glo Herring., MD  Primary Cardiologist: Rozann Lesches, MD   Chief Complaint  Patient presents with  . Atrial Fibrillation    History of Present Illness: Garrett Clements is a 59 y.o. male referred for cardiology consultation by Ms. Glennon Mac PA-C with Dr. Gerarda Fraction. Patient was recently documented to have atrial fibrillation of uncertain duration on office examination. No ECG available for review. He presents today for evaluation, reportedly has memory deficits and also history of anxiety. He tells that he is very anxious at baseline, worries a lot. He has been aware of a sense of palpitations intermittently for quite some time. He also reports what sounds like exertional angina for the last 10 years. He has a long-standing history of heavy tobacco abuse, also hypertension and diabetes.  CHADSVASC score is 2. He was started on Xarelto for stroke prophylaxis by Ms. Glennon Mac PA-C. Also apparently just had recent labwork obtained, results to be requested. Follow-up tracing is noted below.  Today we discussed the situation, diagnosis of atrial fibrillation, and possibly also ischemic heart disease based on symptoms. Not entirely clear how much of our conversation he completely grasped.   Past Medical History  Diagnosis Date  . GERD (gastroesophageal reflux disease)   . Migraines   . Chronic back pain   . Memory deficits   . Tubulovillous adenoma of colon 05/23/10  . Anxiety   . Depression   . Type 2 diabetes mellitus   . Essential hypertension   . H. pylori infection 07/2011  . Hiatal hernia   . Atrial fibrillation     Diagnosed February 2016    Past Surgical History  Procedure Laterality Date  . Umbilical hernia repair  2006  . Cholecystectomy  2006  . Colonoscopy  05/15/10    Rourk-friable anal canal, 1cm pedunculated TVA  mid-sigmoid , diminutive adjacent polyp ablated  .  Esophagogastroduodenoscopy  07/20/2011    Dr. Gala Romney- normal esophagus-s/p maloney dialation, small hiatal hernia, hpylori +    Current Outpatient Prescriptions  Medication Sig Dispense Refill  . ACCU-CHEK AVIVA PLUS test strip   11  . ALPRAZolam (XANAX) 0.5 MG tablet Take 0.5 mg by mouth 4 (four) times daily as needed. For sleep and anxiety    . bismuth-metronidazole-tetracycline (PYLERA) 140-125-125 MG per capsule Take 3 capsules by mouth 4 (four) times daily -  before meals and at bedtime. 24 capsule 4  . enalapril (VASOTEC) 5 MG tablet Take 5 mg by mouth daily.     Marland Kitchen escitalopram (LEXAPRO) 10 MG tablet Take 10 mg by mouth daily.     Marland Kitchen glipiZIDE (GLUCOTROL) 10 MG tablet Take 10 mg by mouth every morning.  2  . meloxicam (MOBIC) 7.5 MG tablet Take 7.5 mg by mouth daily.      . metFORMIN (GLUCOPHAGE) 1000 MG tablet Take 1,000 mg by mouth 2 (two) times daily with a meal.   1   No current facility-administered medications for this visit.    Allergies:  Review of patient's allergies indicates no known allergies.   Social History: The patient  reports that he has been smoking Cigarettes.  He has a 60 pack-year smoking history. He has never used smokeless tobacco. He reports that he drinks alcohol. He reports that he does not use illicit drugs.   Family History: The patient's family history includes Diabetes in his brother and mother.   ROS:  Please see the history of present illness. Otherwise, complete review of systems is positive for anxiety, intermittent headaches.  All other systems are reviewed and negative.    Physical Exam: VS:  BP 128/88 mmHg  Pulse 91  Ht 5\' 8"  (1.727 m)  Wt 207 lb (93.895 kg)  BMI 31.48 kg/m2  SpO2 98%, BMI Body mass index is 31.48 kg/(m^2).  Wt Readings from Last 3 Encounters:  04/19/14 207 lb (93.895 kg)  06/29/11 214 lb 12.8 oz (97.433 kg)  05/09/10 214 lb (97.07 kg)     General: Patient appears comfortable at rest. HEENT: Conjunctiva and lids  normal, oropharynx clear with poor dentition. Neck: Supple, no elevated JVP or carotid bruits, no thyromegaly. Lungs: Clear to auscultation, nonlabored breathing at rest. Cardiac: Irregularly irregular, no S3 or significant systolic murmur, no pericardial rub. Abdomen: Soft, nontender, bowel sounds present, no guarding or rebound. Extremities: No pitting edema, distal pulses 2+. Skin: Warm and dry. Musculoskeletal: No kyphosis. Neuropsychiatric: Alert and oriented x3, anxious, limited historian.   ECG: ECG is ordered today and reviewed showing rate-controlled atrial fibrillation with low voltage and nonspecific ST-T changes.   Assessment and Plan:  1. Persistent atrial fibrillation, duration uncertain. CHADSVASC score is 2-3 based on available information. He was recently started on Xarelto by primary care provider. We will request recent lab work for review. He is aware of intermittent sense of palpitations, 24-hour Holter monitor will be obtained to better assess heart rate variability and determine if he needs any type of rate control agent. An echocardiogram will also be obtained to assess cardiac structure and function. Follow-up arranged in the [place] office.  2. Suspected ischemic heart disease with long-standing history of what sounds like exertional angina. This has not been accelerating by patient report. Discussed basic risk factor modification strategies, potential for further objective ischemic evaluation. Will begin with basic testing as outlined above, and can discuss situation further office follow-up.  3. Long-standing history of heavy tobacco abuse. He has not been able to quit successfully. We have discussed smoking cessation strategies.  4. Essential hypertension, blood pressure is reasonable today. He continues on Vasotec.  5. Type 2 diabetes mellitus, on oral agents. Keep follow-up with primary care.   Current medicines are reviewed at length with the patient  today.  The patient does not have concerns regarding medicines.   Orders Placed This Encounter  Procedures  . EKG 12-Lead  . Holter monitor - 24 hour  . 2D Echocardiogram without contrast    Disposition: FU with me in 4 weeks.   Signed, Satira Sark, MD, [facility] 04/19/2014 9:47 AM    [facility] [address], [place], [place] [address] Phone: [phone]; Fax: [phone]

## 2014-04-21 ENCOUNTER — Other Ambulatory Visit: Payer: Self-pay

## 2014-04-21 ENCOUNTER — Other Ambulatory Visit (INDEPENDENT_AMBULATORY_CARE_PROVIDER_SITE_OTHER): Payer: PRIVATE HEALTH INSURANCE

## 2014-04-21 DIAGNOSIS — I208 Other forms of angina pectoris: Secondary | ICD-10-CM

## 2014-04-21 DIAGNOSIS — I481 Persistent atrial fibrillation: Secondary | ICD-10-CM | POA: Diagnosis not present

## 2014-04-21 DIAGNOSIS — I4819 Other persistent atrial fibrillation: Secondary | ICD-10-CM

## 2014-04-26 ENCOUNTER — Telehealth: Payer: Self-pay | Admitting: *Deleted

## 2014-04-26 NOTE — Telephone Encounter (Signed)
-----   Message from Satira Sark, MD sent at 04/21/2014  4:24 PM EST ----- Reviewed. Obtained in the setting of recently documented atrial fibrillation. LVEF is normal range. No major valvular abnormalities. Will review further office follow-up.

## 2014-04-26 NOTE — Telephone Encounter (Signed)
Brother informed

## 2014-04-27 ENCOUNTER — Telehealth: Payer: Self-pay | Admitting: *Deleted

## 2014-04-27 MED ORDER — METOPROLOL SUCCINATE ER 25 MG PO TB24: 12.5000 mg | ORAL_TABLET | Freq: Every day | ORAL | Status: DC

## 2014-04-27 NOTE — Telephone Encounter (Signed)
Brother Garrett Clements) notified.  [facility]  Reminded to keep already scheduled follow up for 05/21/2014 and MD will go over results in greater detail at that time.  Brother verbalized understanding.

## 2014-04-27 NOTE — Telephone Encounter (Signed)
-----   Message from Satira Sark, MD sent at 04/27/2014 10:55 AM EST ----- Reviewed. See handwritten report. Atrial fibrillation present with some RVR to the 150s noted. Would recommend starting Toprol-XL 12.5 mg daily for now.

## 2014-05-20 ENCOUNTER — Encounter: Payer: Self-pay | Admitting: Adult Health

## 2014-05-20 ENCOUNTER — Ambulatory Visit (INDEPENDENT_AMBULATORY_CARE_PROVIDER_SITE_OTHER): Payer: Commercial Managed Care - HMO | Admitting: Adult Health

## 2014-05-20 VITALS — BP 114/70 | HR 94 | Ht 68.0 in | Wt 216.0 lb

## 2014-05-20 DIAGNOSIS — I481 Persistent atrial fibrillation: Secondary | ICD-10-CM

## 2014-05-20 DIAGNOSIS — I4819 Other persistent atrial fibrillation: Secondary | ICD-10-CM

## 2014-05-20 DIAGNOSIS — I10 Essential (primary) hypertension: Secondary | ICD-10-CM

## 2014-05-20 MED ORDER — METOPROLOL SUCCINATE ER 25 MG PO TB24: 12.5000 mg | ORAL_TABLET | Freq: Every day | ORAL | Status: DC

## 2014-05-20 MED ORDER — ENALAPRIL MALEATE 5 MG PO TABS: 5.0000 mg | ORAL_TABLET | Freq: Every day | ORAL | Status: DC

## 2014-05-20 NOTE — Progress Notes (Signed)
Cardiology Office Note   Date:  05/20/2014   ID:  Garrett, Clements 09-Sep-1955, MRN [id number]  PCP:  Glo Herring., MD  Cardiologist:  [place]/ Jory Sims, NP   Chief Complaint  Patient presents with  . Atrial Fibrillation      History of Present Illness: Garrett Clements is a 59 y.o. male who presents for ongoing assessment and management of atrial fibrillation, normally seen in the [place] office.  The patient was last seen by Dr. Domenic Polite in February 2016, placed on [place] with CHADS VASC Score 2-3. Suspected heart disease, with long-standing history of possible exertional angina.  Treated medically, hypertension, long-standing history of tobacco abuse, and diabetes.  An echocardiogram was ordered to assess cardiac structure and function.  Mild LVH with LVEF 55-60%. Indeterminate diastolic function in the setting of atrial fibrillation. Mild left atrial enlargement. MAC with trivial mitral regurgitation. Mildly ectatic aortic root. Unable to assess PASP.  He comes today with complaints of muscle aches and pains from walking.  He brings with him a walking record showing an average of 2 miles a day.  Blood glucose remains moderately controlled.  He says when he is walking.  He does feel his heart rate go up and he has to, shortness of breath, and diaphoresis.  He has no issues with bleeding.I have explained to him that this is normal.  He is tolerating the Toprol well without complaints of fatigue  He denies any palpitations at rest.  He unfortunately continues to smoke and suffers from a great deal of anxiety.  Past Medical History  Diagnosis Date  . GERD (gastroesophageal reflux disease)   . Migraines   . Chronic back pain   . Memory deficits   . Tubulovillous adenoma of colon 05/23/10  . Anxiety   . Depression   . Type 2 diabetes mellitus   . Essential hypertension   . H. pylori infection 07/2011  . Hiatal hernia   . Atrial fibrillation     Diagnosed February  2016    Past Surgical History  Procedure Laterality Date  . Umbilical hernia repair  2006  . Cholecystectomy  2006  . Colonoscopy  05/15/10    Rourk-friable anal canal, 1cm pedunculated TVA  mid-sigmoid , diminutive adjacent polyp ablated  . Esophagogastroduodenoscopy  07/20/2011    Dr. Gala Romney- normal esophagus-s/p maloney dialation, small hiatal hernia, hpylori +     Current Outpatient Prescriptions  Medication Sig Dispense Refill  . ACCU-CHEK AVIVA PLUS test strip   11  . ALPRAZolam (XANAX) 0.5 MG tablet Take 0.5 mg by mouth 4 (four) times daily as needed. For sleep and anxiety    . enalapril (VASOTEC) 5 MG tablet Take 1 tablet (5 mg total) by mouth daily. 90 tablet 3  . escitalopram (LEXAPRO) 10 MG tablet Take 10 mg by mouth daily.     Marland Kitchen glipiZIDE (GLUCOTROL) 10 MG tablet Take 10 mg by mouth every morning.  2  . meloxicam (MOBIC) 7.5 MG tablet Take 7.5 mg by mouth daily.      . metFORMIN (GLUCOPHAGE) 1000 MG tablet Take 1,000 mg by mouth 2 (two) times daily with a meal.   1  . metoprolol succinate (TOPROL XL) 25 MG 24 hr tablet Take 0.5 tablets (12.5 mg total) by mouth daily. 45 tablet 3   No current facility-administered medications for this visit.    Allergies:   Review of patient's allergies indicates no known allergies.    Social History:  The patient  reports that he has been smoking Cigarettes.  He started smoking about 46 years ago. He has a 60 pack-year smoking history. He has never used smokeless tobacco. He reports that he drinks alcohol. He reports that he does not use illicit drugs.   Family History:  The patient's family history includes Diabetes in his brother and mother.    ROS: .   All other systems are reviewed and negative.Unless otherwise mentioned in H&P above.   PHYSICAL EXAM: VS:  BP 114/70 mmHg  Pulse 94  Ht 5\' 8"  (1.727 m)  Wt 216 lb (97.977 kg)  BMI 32.85 kg/m2  SpO2 97% , BMI Body mass index is 32.85 kg/(m^2). GEN: Well nourished, well  developed, in no acute distress HEENT: normal Neck: no JVD, carotid bruits, or masses Cardiac: RRR; no murmurs, rubs, or gallops,no edema  Respiratory: Bilateral crackles in the bases, occasional coughing with inspiration. MS: no deformity or atrophy Skin: warm and dry, no rash Neuro:  Strength and sensation are intact Psych: Anxious.  Lipid Panel No results found for: CHOL, TRIG, HDL, CHOLHDL, VLDL, LDLCALC, LDLDIRECT    Wt Readings from Last 3 Encounters:  05/20/14 216 lb (97.977 kg)  04/19/14 207 lb (93.895 kg)  06/29/11 214 lb 12.8 oz (97.433 kg)      Other studies Reviewed: Additional studies/ records that were reviewed today include: Echocardiogram, Holter monitor Review of the above records demonstrates: Normal ejection fraction with EF of 55-60%, mild LVH.   ASSESSMENT AND PLAN:  1. Atrial fibrillation:Heart rate is much better controlled on metoprolol 12.5 mg twice a day.he complains that his heart rate goes up when he exercises with associated shortness of breath and diaphoresis.  I have told him that this is normal for him and would be expected with exercise.  Reassurances given.  I have asked him to report any rapid heart rhythms at rest.  He is tolerating the metoprolol. CHADS VASC Score is 2.  2. Hypertension: Blood pressure is well-controlled on enalapril and metoprolol.  He denies any dizziness.  Continue current medication regimen.  We will see him again in 6 months unless he becomes symptomatic  3.Tobacco Abuse: He has been counseled on smoking cessation.  Current medicines are reviewed at length with the patient today.  He has no issues with medications or has questions.   No orders of the defined types were placed in this encounter.     Disposition:   FU with Dr. Domenic Polite, in 6 months.  Signed, Jory Sims, NP  05/20/2014 4:08 PM    [facility] [address], [place], [place] [address] Phone: [phone]; Fax: [phone]

## 2014-05-20 NOTE — Patient Instructions (Signed)
Your physician wants you to follow-up in: 6 months You will receive a reminder letter in the mail two months in advance. If you don't receive a letter, please call our office to schedule the follow-up appointment.     Your physician recommends that you continue on your current medications as directed. Please refer to the Current Medication list given to you today.      Thank you for choosing [facility] !        

## 2014-05-20 NOTE — Progress Notes (Deleted)
Name: Garrett Clements    DOB: [birth year]  Age: 59 y.o.  MR#: [id number]       PCP:  Glo Herring., MD      Insurance: Payor: [employer] MEDICARE / Plan: [place] THN/NTSP / Product Type: *No Product type* /   CC:    Chief Complaint  Patient presents with  . Atrial Fibrillation    VS Filed Vitals:   05/20/14 1542  BP: 114/70  Pulse: 94  Height: 5\' 8"  (1.727 m)  Weight: 216 lb (97.977 kg)  SpO2: 97%    Weights Current Weight  05/20/14 216 lb (97.977 kg)  04/19/14 207 lb (93.895 kg)  06/29/11 214 lb 12.8 oz (97.433 kg)    Blood Pressure  BP Readings from Last 3 Encounters:  05/20/14 114/70  04/19/14 128/88  07/20/11 101/66     Admit date:  (Not on file) Last encounter with RMR:  Visit date not found   Allergy Review of patient's allergies indicates no known allergies.  Current Outpatient Prescriptions  Medication Sig Dispense Refill  . ACCU-CHEK AVIVA PLUS test strip   11  . ALPRAZolam (XANAX) 0.5 MG tablet Take 0.5 mg by mouth 4 (four) times daily as needed. For sleep and anxiety    . enalapril (VASOTEC) 5 MG tablet Take 5 mg by mouth daily.     Marland Kitchen escitalopram (LEXAPRO) 10 MG tablet Take 10 mg by mouth daily.     Marland Kitchen glipiZIDE (GLUCOTROL) 10 MG tablet Take 10 mg by mouth every morning.  2  . meloxicam (MOBIC) 7.5 MG tablet Take 7.5 mg by mouth daily.      . metFORMIN (GLUCOPHAGE) 1000 MG tablet Take 1,000 mg by mouth 2 (two) times daily with a meal.   1  . metoprolol succinate (TOPROL XL) 25 MG 24 hr tablet Take 0.5 tablets (12.5 mg total) by mouth daily. 15 tablet 2   No current facility-administered medications for this visit.    Discontinued Meds:    Medications Discontinued During This Encounter  Medication Reason  . bismuth-metronidazole-tetracycline (PYLERA) 140-125-125 MG per capsule Error    Patient Active Problem List   Diagnosis Date Noted  . Essential hypertension 04/19/2014  . Persistent atrial fibrillation 04/19/2014  . Type 2 diabetes  mellitus 04/19/2014  . GERD (gastroesophageal reflux disease) 06/29/2011    LABS No results found for: NA, K, CL, CO2, GLUCOSE, BUN, CREATININE, CALCIUM, GFRNONAA, GFRAA CMP  No results found for: NA, K, CL, CO2, GLUCOSE, BUN, CREATININE, CALCIUM, PROT, ALBUMIN, AST, ALT, ALKPHOS, BILITOT, GFRNONAA, GFRAA  No results found for: WBC, HGB, HCT, MCV  Lipid Panel  No results found for: CHOL, TRIG, HDL, CHOLHDL, VLDL, LDLCALC, LDLDIRECT  ABG No results found for: PHART, PCO2ART, PO2ART, HCO3, TCO2, ACIDBASEDEF, O2SAT   No results found for: TSH BNP (last 3 results) No results for input(s): BNP in the last 8760 hours.  ProBNP (last 3 results) No results for input(s): PROBNP in the last 8760 hours.  Cardiac Panel (last 3 results) No results for input(s): CKTOTAL, CKMB, TROPONINI, RELINDX in the last 72 hours.  Iron/TIBC/Ferritin/ %Sat No results found for: IRON, TIBC, FERRITIN, IRONPCTSAT   EKG Orders placed or performed in visit on 04/19/14  . EKG 12-Lead  . Holter monitor - 24 hour  . Holter monitor - 24 hour     Prior Assessment and Plan Problem List as of 05/20/2014      Cardiovascular and Mediastinum   Essential hypertension   Persistent atrial  fibrillation     Digestive   GERD (gastroesophageal reflux disease)   Last Assessment & Plan 06/29/2011 Office Visit Edited 06/29/2011  6:01 PM by Andria Meuse, NP    4 year history of chronic GERD despite omeprazole 20mg  daily.  Daily ASA & NSAID use.  Regurgitation of undigested foods & chronic bloating.  EGD to evaluate as above w/ Dr Gala Romney.  I have discussed risks & benefits which include, but are not limited to, bleeding, infection, perforation & drug reaction.  The patient agrees with this plan & written consent will be obtained.           Endocrine   Type 2 diabetes mellitus       Imaging: No results found.

## 2014-05-21 ENCOUNTER — Ambulatory Visit: Payer: Commercial Managed Care - HMO | Admitting: Cardiology

## 2014-06-03 ENCOUNTER — Telehealth: Payer: Self-pay | Admitting: Cardiology

## 2014-06-03 NOTE — Telephone Encounter (Signed)
Garrett Clements called stating that he received a notice from [facility] stating that they would not pay for the Echo because We did not have a referral in place. Mr. Glotfelty was seen by Dr.McDowell two days before the echo was done. I spoke with Charm Rings and she ask that I send to Lucy Chris a message asking her to see if she Can correct.

## 2014-06-09 NOTE — Telephone Encounter (Signed)
Returned call regarding letter from [facility] about echo.  Told him that our outside billing agency would be appealing the denial and should the appeal be denied, that he would not be responsible for the bill.  Also advised him to call us back should he receive a statement so we could contact the billing company.  He voiced understanding.

## 2014-12-16 DIAGNOSIS — I10 Essential (primary) hypertension: Secondary | ICD-10-CM | POA: Diagnosis not present

## 2014-12-16 DIAGNOSIS — E6609 Other obesity due to excess calories: Secondary | ICD-10-CM | POA: Diagnosis not present

## 2014-12-16 DIAGNOSIS — F419 Anxiety disorder, unspecified: Secondary | ICD-10-CM | POA: Diagnosis not present

## 2014-12-16 DIAGNOSIS — E1165 Type 2 diabetes mellitus with hyperglycemia: Secondary | ICD-10-CM | POA: Diagnosis not present

## 2014-12-16 DIAGNOSIS — Z0001 Encounter for general adult medical examination with abnormal findings: Secondary | ICD-10-CM | POA: Diagnosis not present

## 2014-12-16 DIAGNOSIS — E782 Mixed hyperlipidemia: Secondary | ICD-10-CM | POA: Diagnosis not present

## 2014-12-16 DIAGNOSIS — R7301 Impaired fasting glucose: Secondary | ICD-10-CM | POA: Diagnosis not present

## 2014-12-16 DIAGNOSIS — K219 Gastro-esophageal reflux disease without esophagitis: Secondary | ICD-10-CM | POA: Diagnosis not present

## 2014-12-16 DIAGNOSIS — Z6831 Body mass index (BMI) 31.0-31.9, adult: Secondary | ICD-10-CM | POA: Diagnosis not present

## 2016-02-13 DIAGNOSIS — I208 Other forms of angina pectoris: Secondary | ICD-10-CM | POA: Diagnosis not present

## 2016-02-13 DIAGNOSIS — I10 Essential (primary) hypertension: Secondary | ICD-10-CM | POA: Diagnosis not present

## 2016-02-13 DIAGNOSIS — Z6829 Body mass index (BMI) 29.0-29.9, adult: Secondary | ICD-10-CM | POA: Diagnosis not present

## 2016-02-13 DIAGNOSIS — Z0000 Encounter for general adult medical examination without abnormal findings: Secondary | ICD-10-CM | POA: Diagnosis not present

## 2016-02-13 DIAGNOSIS — G64 Other disorders of peripheral nervous system: Secondary | ICD-10-CM | POA: Diagnosis not present

## 2016-02-13 DIAGNOSIS — I4891 Unspecified atrial fibrillation: Secondary | ICD-10-CM | POA: Diagnosis not present

## 2016-02-13 DIAGNOSIS — K219 Gastro-esophageal reflux disease without esophagitis: Secondary | ICD-10-CM | POA: Diagnosis not present

## 2016-02-13 DIAGNOSIS — E782 Mixed hyperlipidemia: Secondary | ICD-10-CM | POA: Diagnosis not present

## 2016-02-13 DIAGNOSIS — E663 Overweight: Secondary | ICD-10-CM | POA: Diagnosis not present

## 2016-02-13 DIAGNOSIS — E1165 Type 2 diabetes mellitus with hyperglycemia: Secondary | ICD-10-CM | POA: Diagnosis not present

## 2016-02-13 DIAGNOSIS — Z1389 Encounter for screening for other disorder: Secondary | ICD-10-CM | POA: Diagnosis not present

## 2016-02-13 DIAGNOSIS — Z23 Encounter for immunization: Secondary | ICD-10-CM | POA: Diagnosis not present

## 2017-01-10 DIAGNOSIS — E663 Overweight: Secondary | ICD-10-CM | POA: Diagnosis not present

## 2017-01-10 DIAGNOSIS — E1165 Type 2 diabetes mellitus with hyperglycemia: Secondary | ICD-10-CM | POA: Diagnosis not present

## 2017-01-10 DIAGNOSIS — Z6828 Body mass index (BMI) 28.0-28.9, adult: Secondary | ICD-10-CM | POA: Diagnosis not present

## 2017-01-10 DIAGNOSIS — J42 Unspecified chronic bronchitis: Secondary | ICD-10-CM | POA: Diagnosis not present

## 2017-01-23 ENCOUNTER — Emergency Department (HOSPITAL_COMMUNITY): Payer: Commercial Managed Care - HMO

## 2017-01-23 ENCOUNTER — Emergency Department (HOSPITAL_COMMUNITY)
Admission: EM | Admit: 2017-01-23 | Discharge: 2017-01-24 | Disposition: A | Discharge status: Home / Self Care | Payer: Commercial Managed Care - HMO | Attending: Emergency Medicine | Admitting: Emergency Medicine

## 2017-01-23 ENCOUNTER — Other Ambulatory Visit: Payer: Self-pay

## 2017-01-23 ENCOUNTER — Encounter (HOSPITAL_COMMUNITY): Payer: Self-pay | Admitting: *Deleted

## 2017-01-23 DIAGNOSIS — E860 Dehydration: Secondary | ICD-10-CM | POA: Insufficient documentation

## 2017-01-23 DIAGNOSIS — E119 Type 2 diabetes mellitus without complications: Secondary | ICD-10-CM | POA: Diagnosis not present

## 2017-01-23 DIAGNOSIS — Z7984 Long term (current) use of oral hypoglycemic drugs: Secondary | ICD-10-CM | POA: Insufficient documentation

## 2017-01-23 DIAGNOSIS — I481 Persistent atrial fibrillation: Secondary | ICD-10-CM | POA: Insufficient documentation

## 2017-01-23 DIAGNOSIS — I10 Essential (primary) hypertension: Secondary | ICD-10-CM | POA: Diagnosis not present

## 2017-01-23 DIAGNOSIS — Z79899 Other long term (current) drug therapy: Secondary | ICD-10-CM | POA: Insufficient documentation

## 2017-01-23 DIAGNOSIS — F17210 Nicotine dependence, cigarettes, uncomplicated: Secondary | ICD-10-CM | POA: Insufficient documentation

## 2017-01-23 DIAGNOSIS — I4891 Unspecified atrial fibrillation: Secondary | ICD-10-CM | POA: Diagnosis not present

## 2017-01-23 DIAGNOSIS — R197 Diarrhea, unspecified: Secondary | ICD-10-CM | POA: Diagnosis not present

## 2017-01-23 DIAGNOSIS — R112 Nausea with vomiting, unspecified: Secondary | ICD-10-CM | POA: Diagnosis not present

## 2017-01-23 DIAGNOSIS — R1110 Vomiting, unspecified: Secondary | ICD-10-CM | POA: Diagnosis not present

## 2017-01-23 DIAGNOSIS — R109 Unspecified abdominal pain: Secondary | ICD-10-CM | POA: Diagnosis not present

## 2017-01-23 LAB — COMPREHENSIVE METABOLIC PANEL
ALK PHOS: 89 U/L (ref 38–126)
ALT: 28 U/L (ref 17–63)
ANION GAP: 14 (ref 5–15)
AST: 23 U/L (ref 15–41)
Albumin: 4.6 g/dL (ref 3.5–5.0)
BILIRUBIN TOTAL: 2.1 mg/dL — AB (ref 0.3–1.2)
BUN: 34 mg/dL — ABNORMAL HIGH (ref 6–20)
CALCIUM: 9.6 mg/dL (ref 8.9–10.3)
CO2: 21 mmol/L — ABNORMAL LOW (ref 22–32)
Chloride: 96 mmol/L — ABNORMAL LOW (ref 101–111)
Creatinine, Ser: 1.12 mg/dL (ref 0.61–1.24)
GFR calc non Af Amer: >60 mL/min (ref >60)
GLUCOSE: 180 mg/dL — AB (ref 65–99)
Potassium: 3.8 mmol/L (ref 3.5–5.1)
Sodium: 131 mmol/L — ABNORMAL LOW (ref 135–145)
Total Protein: 8.2 g/dL — ABNORMAL HIGH (ref 6.5–8.1)

## 2017-01-23 LAB — CBC WITH DIFFERENTIAL/PLATELET
Basophils Absolute: 0 10*3/uL (ref 0.0–0.1)
Basophils Relative: 0 %
EOS PCT: 1 %
Eosinophils Absolute: 0.1 10*3/uL (ref 0.0–0.7)
HCT: 49.1 % (ref 39.0–52.0)
HEMOGLOBIN: 17.2 g/dL — AB (ref 13.0–17.0)
Lymphocytes Relative: 22 %
Lymphs Abs: 3.7 10*3/uL (ref 0.7–4.0)
MCH: 30 pg (ref 26.0–34.0)
MCHC: 35 g/dL (ref 30.0–36.0)
MCV: 85.7 fL (ref 78.0–100.0)
Monocytes Absolute: 1 10*3/uL (ref 0.1–1.0)
Monocytes Relative: 6 %
NEUTROS ABS: 12.2 10*3/uL — AB (ref 1.7–7.7)
NEUTROS PCT: 71 %
PLATELETS: 346 10*3/uL (ref 150–400)
RBC: 5.73 MIL/uL (ref 4.22–5.81)
RDW: 12.9 % (ref 11.5–15.5)
WBC: 17 10*3/uL — ABNORMAL HIGH (ref 4.0–10.5)

## 2017-01-23 LAB — URINALYSIS, ROUTINE W REFLEX MICROSCOPIC
BILIRUBIN URINE: NEGATIVE
GLUCOSE, UA: 50 mg/dL — AB
Hgb urine dipstick: NEGATIVE
Ketones, ur: 5 mg/dL — AB
Leukocytes, UA: NEGATIVE
Nitrite: NEGATIVE
Protein, ur: NEGATIVE mg/dL
pH: 5 (ref 5.0–8.0)

## 2017-01-23 LAB — LIPASE, BLOOD: Lipase: 39 U/L (ref 11–51)

## 2017-01-23 MED ORDER — SODIUM CHLORIDE 0.9 % IV BOLUS (SEPSIS)
500.0000 mL | Freq: Once | INTRAVENOUS | Status: AC
  Administered 2017-01-23: 500 mL via INTRAVENOUS

## 2017-01-23 MED ORDER — SODIUM CHLORIDE 0.9 % IV SOLN
INTRAVENOUS | Status: DC
  Administered 2017-01-23: 20:00:00 via INTRAVENOUS

## 2017-01-23 MED ORDER — PANTOPRAZOLE SODIUM 40 MG IV SOLR
40.0000 mg | Freq: Once | INTRAVENOUS | Status: AC
  Administered 2017-01-23: 40 mg via INTRAVENOUS
  Filled 2017-01-23: qty 40

## 2017-01-23 MED ORDER — PROMETHAZINE HCL 25 MG PO TABS: 25.0000 mg | ORAL_TABLET | Freq: Four times a day (QID) | ORAL | 1 refills | Status: DC | PRN

## 2017-01-23 MED ORDER — LOPERAMIDE HCL 2 MG PO TABS: 2.0000 mg | ORAL_TABLET | Freq: Four times a day (QID) | ORAL | 0 refills | Status: DC | PRN

## 2017-01-23 MED ORDER — HYDROMORPHONE HCL 1 MG/ML IJ SOLN
1.0000 mg | Freq: Once | INTRAMUSCULAR | Status: AC
  Administered 2017-01-23: 1 mg via INTRAVENOUS
  Filled 2017-01-23: qty 1

## 2017-01-23 MED ORDER — IOPAMIDOL (ISOVUE-300) INJECTION 61%
100.0000 mL | Freq: Once | INTRAVENOUS | Status: AC | PRN
  Administered 2017-01-23: 100 mL via INTRAVENOUS

## 2017-01-23 MED ORDER — SODIUM CHLORIDE 0.9 % IV BOLUS (SEPSIS)
1000.0000 mL | Freq: Once | INTRAVENOUS | Status: AC
  Administered 2017-01-23: 1000 mL via INTRAVENOUS

## 2017-01-23 MED ORDER — ONDANSETRON HCL 4 MG/2ML IJ SOLN
4.0000 mg | Freq: Once | INTRAMUSCULAR | Status: AC
  Administered 2017-01-23: 4 mg via INTRAVENOUS
  Filled 2017-01-23: qty 2

## 2017-01-23 NOTE — ED Provider Notes (Signed)
[facility] EMERGENCY DEPARTMENT Provider Note   CSN: [id number] Arrival date & time: 01/23/17  [name]     History   Chief Complaint Chief Complaint  Patient presents with  . Abdominal Pain  . Emesis    HPI Garrett Clements is a 61 y.o. male.  Patient brought in by daughter.  Complaint is vomiting and diarrhea and abdominal pain past 2 days.  About 3-4 episodes of vomiting and diarrhea is been about 5 times a day.  No blood in either 1.  Abdominal pain is generalized.  Patient clinically feels as if he is dehydrated.  Patient has a past medical history listed of atrial fibrillation.  Monitor looks like atrial fibrillation.  The patient is not on any blood thinners not been seen by cardiology in many years.  Patient is followed by Garrett Clements for his primary care.  Patient is now staying with daughter and she is trying to help him manage medications more appropriately.      Past Medical History:  Diagnosis Date  . Anxiety   . Atrial fibrillation [facility])    Diagnosed February 2016  . Chronic back pain   . Depression   . Essential hypertension   . GERD (gastroesophageal reflux disease)   . H. pylori infection 07/2011  . Hiatal hernia   . Memory deficits   . Migraines   . Tubulovillous adenoma of colon 05/23/10  . Type 2 diabetes mellitus [facility])     Patient Active Problem List   Diagnosis Date Noted  . Essential hypertension 04/19/2014  . Persistent atrial fibrillation ([place]) 04/19/2014  . Type 2 diabetes mellitus ([place]) 04/19/2014  . GERD (gastroesophageal reflux disease) 06/29/2011    Past Surgical History:  Procedure Laterality Date  . CHOLECYSTECTOMY  2006  . COLONOSCOPY  05/15/10   Rourk-friable anal canal, 1cm pedunculated TVA  mid-sigmoid , diminutive adjacent polyp ablated  . ESOPHAGOGASTRODUODENOSCOPY  07/20/2011   Dr. Gala Romney- normal esophagus-s/p maloney dialation, small hiatal hernia, hpylori +  . UMBILICAL HERNIA REPAIR  2006       Home Medications    Prior  to Admission medications   Medication Sig Start Date End Date Taking? Authorizing Provider  atorvastatin (LIPITOR) 20 MG tablet Take 20 mg by mouth every morning.   Yes [provider]  clonazePAM (KLONOPIN) 0.5 MG tablet Take 0.5 mg by mouth daily as needed for anxiety.   Yes [provider]  glipiZIDE (GLUCOTROL) 10 MG tablet Take 10 mg by mouth every morning. 04/13/14  Yes [provider]  lisinopril (PRINIVIL,ZESTRIL) 5 MG tablet Take 5 mg by mouth every morning.   Yes [provider]  metFORMIN (GLUCOPHAGE) 1000 MG tablet Take 1,000 mg by mouth 2 (two) times daily with a meal.  03/29/14  Yes [provider]  sertraline (ZOLOFT) 25 MG tablet Take 25 mg by mouth daily.   Yes [provider]  loperamide (IMODIUM A-D) 2 MG tablet Take 1 tablet (2 mg total) by mouth 4 (four) times daily as needed for diarrhea or loose stools. 01/23/17   Fredia Sorrow, MD  promethazine (PHENERGAN) 25 MG tablet Take 1 tablet (25 mg total) by mouth every 6 (six) hours as needed. 01/23/17   Fredia Sorrow, MD    Family History Family History  Problem Relation Age of Onset  . Diabetes Mother   . Diabetes Brother     Social History Social History   Tobacco Use  . Smoking status: Current Every Day Smoker  Packs/day: 1.50    Years: 40.00    Pack years: 60.00    Types: Cigarettes    Start date: 05/19/1968  . Smokeless tobacco: Never Used  Substance Use Topics  . Alcohol use: Yes    Alcohol/week: 0.0 oz    Comment: 1-2 a year  . Drug use: No     Allergies   Patient has no known allergies.   Review of Systems Review of Systems  Constitutional: Positive for fatigue.  HENT: Negative for congestion and trouble swallowing.   Eyes: Negative for redness.  Respiratory: Negative for shortness of breath.   Cardiovascular: Negative for chest pain.  Gastrointestinal: Positive for abdominal pain, diarrhea, nausea and vomiting.  Genitourinary:  Negative for dysuria.  Musculoskeletal: Negative for back pain.  Skin: Negative for rash.  Neurological: Negative for syncope and headaches.  Hematological: Does not bruise/bleed easily.  Psychiatric/Behavioral: Negative for confusion.     Physical Exam Updated Vital Signs BP 108/74   Pulse (!) 59   Temp 97.8 F (36.6 C) (Oral)   Resp 18   Ht 1.727 m (5\' 8" )   Wt 86.2 kg (190 lb)   SpO2 98%   BMI 28.89 kg/m   Physical Exam  Constitutional: He is oriented to person, place, and time. He appears well-developed and well-nourished. No distress.  HENT:  Head: Normocephalic and atraumatic.  Mucous membranes dry  Eyes: Conjunctivae and EOM are normal. Pupils are equal, round, and reactive to light.  Neck: Normal range of motion. Neck supple.  Cardiovascular: Normal rate and regular rhythm.  Slightly tachycardic  Pulmonary/Chest: Effort normal and breath sounds normal.  Abdominal: Soft. Bowel sounds are normal. He exhibits no distension. There is no tenderness.  Musculoskeletal: Normal range of motion. He exhibits no edema.  Neurological: He is alert and oriented to person, place, and time. No cranial nerve deficit or sensory deficit. He exhibits normal muscle tone. Coordination normal.  Skin: Skin is warm. He is not diaphoretic.  Nursing note and vitals reviewed.    ED Treatments / Results  Labs (all labs ordered are listed, but only abnormal results are displayed) Labs Reviewed  COMPREHENSIVE METABOLIC PANEL - Abnormal; Notable for the following components:      Result Value   Sodium 131 (*)    Chloride 96 (*)    CO2 21 (*)    Glucose, Bld 180 (*)    BUN 34 (*)    Total Protein 8.2 (*)    Total Bilirubin 2.1 (*)    All other components within normal limits  CBC WITH DIFFERENTIAL/PLATELET - Abnormal; Notable for the following components:   WBC 17.0 (*)    Hemoglobin 17.2 (*)    Neutro Abs 12.2 (*)    All other components within normal limits  URINALYSIS, ROUTINE W  REFLEX MICROSCOPIC - Abnormal; Notable for the following components:   APPearance HAZY (*)    Specific Gravity, Urine >1.046 (*)    Glucose, UA 50 (*)    Ketones, ur 5 (*)    All other components within normal limits  LIPASE, BLOOD    EKG  EKG Interpretation None      ED ECG REPORT   Date: 01/23/2017  Rate: 91  Rhythm: atrial fibrillation  QRS Axis: normal  Intervals: normal  ST/T Wave abnormalities: normal  Conduction Disutrbances:none  Narrative Interpretation:   Old EKG Reviewed: none available  I have personally reviewed the EKG tracing and agree with the computerized printout as noted.   Radiology  Dg Chest 2 View  Result Date: 01/23/2017 CLINICAL DATA:  Abdomen pain EXAM: CHEST  2 VIEW COMPARISON:  07/18/2004 FINDINGS: Small calcified lung nodules compatible with granuloma. No consolidation or effusion. Stable cardiomediastinal silhouette with atherosclerosis. No pneumothorax IMPRESSION: No active cardiopulmonary disease. Electronically Signed   By: Donavan Foil M.D.   On: 01/23/2017 22:42   Ct Abdomen Pelvis W Contrast  Result Date: 01/23/2017 CLINICAL DATA:  Vomiting, diarrhea and abdominal pain EXAM: CT ABDOMEN AND PELVIS WITH CONTRAST TECHNIQUE: Multidetector CT imaging of the abdomen and pelvis was performed using the standard protocol following bolus administration of intravenous contrast. CONTRAST:  157mL ISOVUE-300 IOPAMIDOL (ISOVUE-300) INJECTION 61% COMPARISON:  CT abdomen pelvis 07/18/2004 FINDINGS: Lower chest: No pulmonary nodules or pleural effusion. No visible pericardial effusion. Hepatobiliary: Normal hepatic contours and density. No visible biliary dilatation. Status post cholecystectomy. Pancreas: Normal contours without ductal dilatation. No peripancreatic fluid collection. Spleen: Normal. Adrenals/Urinary Tract: --Adrenal glands: Normal. --Right kidney/ureter: No hydronephrosis or perinephric stranding. No nephrolithiasis. No obstructing ureteral  stones. --Left kidney/ureter: No hydronephrosis or perinephric stranding. No nephrolithiasis. No obstructing ureteral stones. 1.8 cm left renal cyst. --Urinary bladder: Unremarkable. Stomach/Bowel: --Stomach/Duodenum: No hiatal hernia or other gastric abnormality. Normal duodenal course and caliber. --Small bowel: No dilatation or inflammation. --Colon: No focal abnormality. --Appendix: Normal. Vascular/Lymphatic: Atherosclerotic calcification is present within the non-aneurysmal abdominal aorta, without hemodynamically significant stenosis. No abdominal or pelvic lymphadenopathy. Reproductive: Normal prostate and seminal vesicles. Musculoskeletal. No bony spinal canal stenosis or focal osseous abnormality. Other: None. IMPRESSION: 1. No acute abnormality of the abdomen or pelvis. 2.  Aortic Atherosclerosis (ICD10-I70.0). Electronically Signed   By: Ulyses Jarred M.D.   On: 01/23/2017 21:15    Procedures Procedures (including critical care time)  Medications Ordered in ED Medications  0.9 %  sodium chloride infusion ( Intravenous Stopped 01/24/17 0010)  sodium chloride 0.9 % bolus 500 mL (0 mLs Intravenous Stopped 01/23/17 2212)  ondansetron (ZOFRAN) injection 4 mg (4 mg Intravenous Given 01/23/17 1951)  HYDROmorphone (DILAUDID) injection 1 mg (1 mg Intravenous Given 01/23/17 1951)  pantoprazole (PROTONIX) injection 40 mg (40 mg Intravenous Given 01/23/17 1953)  iopamidol (ISOVUE-300) 61 % injection 100 mL (100 mLs Intravenous Contrast Given 01/23/17 2049)  sodium chloride 0.9 % bolus 1,000 mL (0 mLs Intravenous Stopped 01/24/17 0010)     Initial Impression / Assessment and Plan / ED Course  I have reviewed the triage vital signs and the nursing notes.  Pertinent labs & imaging results that were available during my care of the patient were reviewed by me and considered in my medical decision making (see chart for details).    Patient clinically dehydrated.  Labs of support this.  Most likely  secondary to the 2 days of nausea vomiting and diarrhea.  Patient improved here significantly with IV hydration.  Received 1.5 L.  Also received antinausea medicine.  Abdomen was soft no acute findings.  Chest x-ray was negative CT of abdomen without any specific findings.  Patient will be treated symptomatically.  Patient's cardiac monitor and EKG consistent with rate controlled atrial fibrillation.  Patient will require follow-up with cardiology.  Patient also has follow-up with [facility] medical available.  Patient's daughter will get him seen by cardiology.  They will return for any new or worse symptoms.  Pushing fluids with sugar and then bland diet discussed.   Final Clinical Impressions(s) / ED Diagnoses   Final diagnoses:  Dehydration  Nausea vomiting and diarrhea  Atrial fibrillation, unspecified type ([place])  ED Discharge Orders        Ordered    promethazine (PHENERGAN) 25 MG tablet  Every 6 hours PRN     01/23/17 2359    loperamide (IMODIUM A-D) 2 MG tablet  4 times daily PRN     01/23/17 2359       Fredia Sorrow, MD 01/24/17 0018

## 2017-01-23 NOTE — ED Triage Notes (Signed)
Pt c/o vomiting, diarrhea, abdominal pain x 2 days. Denies fever.

## 2017-01-24 NOTE — Discharge Instructions (Signed)
Make an appointment to follow-up with cardiology for the atrial fibrillation.  Take the Phenergan for the nausea and vomiting.  Take the Imodium for the diarrhea.  Make an appointment also to follow-up with your primary care doctor in the next few days.  Return for any new or worse symptoms.  Increase fluids with sugar advance to bland diet as tolerated.

## 2017-01-28 DIAGNOSIS — Z6827 Body mass index (BMI) 27.0-27.9, adult: Secondary | ICD-10-CM | POA: Diagnosis not present

## 2017-01-28 DIAGNOSIS — I481 Persistent atrial fibrillation: Secondary | ICD-10-CM | POA: Diagnosis not present

## 2017-01-28 DIAGNOSIS — B070 Plantar wart: Secondary | ICD-10-CM | POA: Diagnosis not present

## 2017-01-28 DIAGNOSIS — E663 Overweight: Secondary | ICD-10-CM | POA: Diagnosis not present

## 2017-01-31 ENCOUNTER — Encounter: Payer: Self-pay | Admitting: Cardiology

## 2017-01-31 ENCOUNTER — Ambulatory Visit: Payer: Medicare HMO | Admitting: Cardiology

## 2017-01-31 VITALS — BP 118/78 | HR 108 | Ht 68.0 in | Wt 188.0 lb

## 2017-01-31 DIAGNOSIS — I482 Chronic atrial fibrillation, unspecified: Secondary | ICD-10-CM

## 2017-01-31 DIAGNOSIS — I10 Essential (primary) hypertension: Secondary | ICD-10-CM

## 2017-01-31 DIAGNOSIS — E1165 Type 2 diabetes mellitus with hyperglycemia: Secondary | ICD-10-CM | POA: Diagnosis not present

## 2017-01-31 DIAGNOSIS — E782 Mixed hyperlipidemia: Secondary | ICD-10-CM | POA: Diagnosis not present

## 2017-01-31 MED ORDER — RIVAROXABAN 20 MG PO TABS: 20.0000 mg | ORAL_TABLET | Freq: Every day | ORAL | 6 refills | Status: DC

## 2017-01-31 NOTE — Patient Instructions (Addendum)
Your physician recommends that you schedule a follow-up appointment in:  3 months with Dr Domenic Polite  Please get lab work JUST BEFORE next visit :cbc, bmet   STOP Xarelto 15 mg   START Xarelto 20 mg daily   No tests ordered today.   Samples Xarelto 20 mg , 3 bottles, lot [id number], exp 3/21   Thank you for choosing [facility] !

## 2017-01-31 NOTE — Progress Notes (Signed)
Cardiology Office Note  Date: 01/31/2017   ID: Nevin, Grizzle 1955-04-27, MRN [id number]  PCP: Redmond School, MD  Primary Cardiologist: Rozann Lesches, MD   Chief Complaint  Patient presents with  . Atrial Fibrillation    History of Present Illness: Garrett Clements is a 61 y.o. male not seen since 2016 when he was seen for persistent atrial fibrillation in referral from [place].  At that time he was on Xarelto for stroke prophylaxis and low-dose Toprol-XL.  He has not had regular follow-up since 2016, was recently seen in the ER for unrelated symptoms and noted to be in atrial fibrillation resulting in this referral back to the office.  He is here with family member who is now helping him to keep track of his medications.  He does not report palpitations or chest pain.  We went over home blood pressure and heart rate checks, in general his heart rate has been in the 80s at rest.  CHADSVASC score is 2.  It is not clear when he went off of Xarelto since seen in 2016, but he was placed back on this medication at recent visit a few days ago at [place].  He was placed however on the DVT treatment dose.  At this time he is not on any heart rate control medications.  I reviewed his recent ECG and lab work.  Family member indicates that he gets very anxious and nervous at times, heart rate goes up then.  He has had fluctuating appetite and weight loss, although it seems to be getting back on track with recent medical interventions.   Past Medical History:  Diagnosis Date  . Anxiety   . Atrial fibrillation [facility])    Diagnosed February 2016  . Chronic back pain   . Depression   . Essential hypertension   . GERD (gastroesophageal reflux disease)   . H. pylori infection 07/2011  . Hiatal hernia   . Memory deficits   . Migraines   . Tubulovillous adenoma of colon 05/23/10  . Type 2 diabetes mellitus ([place])     Past Surgical History:  Procedure Laterality Date  . CHOLECYSTECTOMY   2006  . COLONOSCOPY  05/15/10   Rourk-friable anal canal, 1cm pedunculated TVA  mid-sigmoid , diminutive adjacent polyp ablated  . ESOPHAGOGASTRODUODENOSCOPY  07/20/2011   Dr. Gala Romney- normal esophagus-s/p maloney dialation, small hiatal hernia, hpylori +  . UMBILICAL HERNIA REPAIR  2006    Current Outpatient Medications  Medication Sig Dispense Refill  . atorvastatin (LIPITOR) 20 MG tablet Take 20 mg by mouth every morning.    . clonazePAM (KLONOPIN) 0.5 MG tablet Take 0.5 mg by mouth daily as needed for anxiety.    Marland Kitchen glipiZIDE (GLUCOTROL) 10 MG tablet Take 10 mg by mouth every morning.  2  . lisinopril (PRINIVIL,ZESTRIL) 5 MG tablet Take 5 mg by mouth every morning.    . loperamide (IMODIUM A-D) 2 MG tablet Take 1 tablet (2 mg total) by mouth 4 (four) times daily as needed for diarrhea or loose stools. 30 tablet 0  . metFORMIN (GLUCOPHAGE) 1000 MG tablet Take 1,000 mg by mouth 2 (two) times daily with a meal.   1  . promethazine (PHENERGAN) 25 MG tablet Take 1 tablet (25 mg total) by mouth every 6 (six) hours as needed. 12 tablet 1  . Rivaroxaban (XARELTO) 15 MG TABS tablet Take 15 mg by mouth 2 (two) times daily with a meal.    . sertraline (ZOLOFT) 25  MG tablet Take 25 mg by mouth daily.     No current facility-administered medications for this visit.    Allergies:  Patient has no known allergies.   Social History: The patient  reports that he has been smoking cigarettes.  He started smoking about 48 years ago. He has a 60.00 pack-year smoking history. he has never used smokeless tobacco. He reports that he drinks alcohol. He reports that he does not use drugs.   Family History: The patient's family history includes Diabetes in his brother and mother.   ROS:  Please see the history of present illness. Otherwise, complete review of systems is positive for anxiety.  All other systems are reviewed and negative.   Physical Exam: VS:  BP 118/78 (BP Location: Left Arm)   Pulse (!) 108  Comment: patient states he's nervous  Ht 5' 8"  (1.727 m)   Wt 188 lb (85.3 kg)   SpO2 96%   BMI 28.59 kg/m , BMI Body mass index is 28.59 kg/m.  Wt Readings from Last 3 Encounters:  01/31/17 188 lb (85.3 kg)  01/23/17 190 lb (86.2 kg)  05/20/14 216 lb (98 kg)    General: Chronically ill-appearing anxious male, no distress. HEENT: Conjunctiva and lids normal, oropharynx clear. Neck: Supple, no elevated JVP or carotid bruits, no thyromegaly. Lungs: Clear to auscultation, nonlabored breathing at rest. Cardiac: Irregularly irregular, no S3 or significant systolic murmur. Abdomen: Soft, nontender, bowel sounds present. Extremities: No pitting edema, distal pulses 2+. Skin: Warm and dry. Musculoskeletal: No kyphosis. Neuropsychiatric: Alert and oriented x3, affect grossly appropriate.  ECG: I personally reviewed the tracing from 01/23/2017 which showed rate controlled atrial fibrillation with borderline low voltage and nonspecific T wave changes.  Recent Labwork: 01/23/2017: ALT 28; AST 23; BUN 34; Creatinine, Ser 1.12; Hemoglobin 17.2; Platelets 346; Potassium 3.8; Sodium 131   Other Studies Reviewed Today:  Echocardiogram 04/21/2014: Study Conclusions  - Left ventricle: The cavity size was normal. Wall thickness was increased in a pattern of mild LVH. Systolic function was normal. The estimated ejection fraction was in the range of 55% to 60%. Wall motion was normal; there were no regional wall motion abnormalities. The study is not technically sufficient to allow evaluation of LV diastolic function. - Aortic valve: Mildly calcified annulus. Trileaflet. There was no significant regurgitation. - Aortic root: The aortic root was mildly ectatic. - Mitral valve: Calcified annulus. There was trivial regurgitation. - Left atrium: The atrium was mildly dilated. - Right atrium: Central venous pressure (est): 3 mm Hg. - Atrial septum: No defect or patent foramen ovale was  identified. - Tricuspid valve: There was trivial regurgitation. - Pulmonary arteries: Systolic pressure could not be accurately estimated. - Pericardium, extracardiac: There was no pericardial effusion.  Impressions:  - Mild LVH with LVEF 55-60%. Indeterminate diastolic function in the setting of atrial fibrillation. Mild left atrial enlargement. MAC with trivial mitral regurgitation. Mildly ectatic aortic root. Unable to assess PASP.  Chest x-ray 01/23/2017: FINDINGS: Small calcified lung nodules compatible with granuloma. No consolidation or effusion. Stable cardiomediastinal silhouette with atherosclerosis. No pneumothorax  IMPRESSION: No active cardiopulmonary disease.  Assessment and Plan:  1.  Chronic atrial fibrillation, last seen in our practice back in 2016.  CHADSVASC score is 2.  I reviewed recent records and discussed medications that were added with the patient's family member.  Recommend changing Xarelto to 20 mg daily for stroke prophylaxis with atrial fibrillation.  Do not plan to add heart rate control medications at this  point, but they will keep track of his heart rates at home.  Follow-up in the next 3 months with CBC and BMET.  2.  Type 2 diabetes mellitus, recently with poor control and subsequent medication adjustments.  He follows with [place].  3.  Essential hypertension, blood pressure control is good today on lisinopril.  4.  Hyperlipidemia, on Lipitor.  Current medicines were reviewed with the patient today.   Orders Placed This Encounter  Procedures  . CBC  . Basic Metabolic Panel (BMET)    Disposition: Follow-up in 3 months.  Signed, Satira Sark, MD, [facility] 01/31/2017 11:55 AM    [facility]. [address] [place], [place] [address] Phone: [phone]; Fax: [phone]

## 2017-02-21 DIAGNOSIS — E663 Overweight: Secondary | ICD-10-CM | POA: Diagnosis not present

## 2017-02-21 DIAGNOSIS — Z6827 Body mass index (BMI) 27.0-27.9, adult: Secondary | ICD-10-CM | POA: Diagnosis not present

## 2017-02-21 DIAGNOSIS — Z23 Encounter for immunization: Secondary | ICD-10-CM | POA: Diagnosis not present

## 2017-02-21 DIAGNOSIS — I2510 Atherosclerotic heart disease of native coronary artery without angina pectoris: Secondary | ICD-10-CM | POA: Diagnosis not present

## 2017-02-21 DIAGNOSIS — E1165 Type 2 diabetes mellitus with hyperglycemia: Secondary | ICD-10-CM | POA: Diagnosis not present

## 2017-02-21 DIAGNOSIS — I4891 Unspecified atrial fibrillation: Secondary | ICD-10-CM | POA: Diagnosis not present

## 2017-02-21 DIAGNOSIS — Z0001 Encounter for general adult medical examination with abnormal findings: Secondary | ICD-10-CM | POA: Diagnosis not present

## 2017-02-21 DIAGNOSIS — Z1389 Encounter for screening for other disorder: Secondary | ICD-10-CM | POA: Diagnosis not present

## 2017-02-21 DIAGNOSIS — F419 Anxiety disorder, unspecified: Secondary | ICD-10-CM | POA: Diagnosis not present

## 2017-02-21 DIAGNOSIS — Z125 Encounter for screening for malignant neoplasm of prostate: Secondary | ICD-10-CM | POA: Diagnosis not present

## 2017-02-21 DIAGNOSIS — E782 Mixed hyperlipidemia: Secondary | ICD-10-CM | POA: Diagnosis not present

## 2017-02-21 DIAGNOSIS — M1991 Primary osteoarthritis, unspecified site: Secondary | ICD-10-CM | POA: Diagnosis not present

## 2017-02-28 ENCOUNTER — Encounter: Payer: Self-pay | Admitting: Neurology

## 2017-03-01 ENCOUNTER — Other Ambulatory Visit (HOSPITAL_COMMUNITY): Payer: Self-pay | Admitting: Internal Medicine

## 2017-03-01 DIAGNOSIS — M549 Dorsalgia, unspecified: Secondary | ICD-10-CM

## 2017-03-08 NOTE — Progress Notes (Deleted)
Garrett Clements was seen today in the movement disorders clinic for neurologic consultation at the request of Redmond School, MD.  The consultation is for the evaluation of "abnormal tone" and "eval for movement disorder."  The records that were made available to me were reviewed but no records available about abnormal tone/movement abnormalities.   Specific Symptoms:  Tremor: {yes no:314532} Family hx of similar:  {yes no:314532} Voice: *** Sleep: ***  Vivid Dreams:  {yes no:314532}  Acting out dreams:  {yes no:314532} Wet Pillows: {yes no:314532} Postural symptoms:  {yes no:314532}  Falls?  {yes no:314532} Bradykinesia symptoms: {parkinson brady:18041} Loss of smell:  {yes no:314532} Loss of taste:  {yes no:314532} Urinary Incontinence:  {yes no:314532} Difficulty Swallowing:  {yes no:314532} Handwriting, micrographia: {yes no:314532} Trouble with ADL's:  {yes no:314532}  Trouble buttoning clothing: {yes no:314532} Depression:  {yes no:314532} Memory changes:  {yes no:314532} Hallucinations:  {yes no:314532}  visual distortions: {yes no:314532} N/V:  {yes no:314532} Lightheaded:  {yes no:314532}  Syncope: {yes no:314532} Diplopia:  {yes no:314532} Dyskinesia:  {yes no:314532}  Neuroimaging of the brain has *** previously been performed.  It *** available for my review today.  PREVIOUS MEDICATIONS: {Parkinson's RX:18200}  ALLERGIES:  No Known Allergies  CURRENT MEDICATIONS:  Outpatient Encounter Medications as of 03/12/2017  Medication Sig  . atorvastatin (LIPITOR) 20 MG tablet Take 20 mg by mouth every morning.  . clonazePAM (KLONOPIN) 0.5 MG tablet Take 0.5 mg by mouth daily as needed for anxiety.  Marland Kitchen glipiZIDE (GLUCOTROL) 10 MG tablet Take 10 mg by mouth every morning.  Marland Kitchen lisinopril (PRINIVIL,ZESTRIL) 5 MG tablet Take 5 mg by mouth every morning.  . loperamide (IMODIUM A-D) 2 MG tablet Take 1 tablet (2 mg total) by mouth 4 (four) times daily as needed for diarrhea  or loose stools.  . metFORMIN (GLUCOPHAGE) 1000 MG tablet Take 1,000 mg by mouth 2 (two) times daily with a meal.   . promethazine (PHENERGAN) 25 MG tablet Take 1 tablet (25 mg total) by mouth every 6 (six) hours as needed.  . rivaroxaban (XARELTO) 20 MG TABS tablet Take 1 tablet (20 mg total) by mouth daily with supper.  . sertraline (ZOLOFT) 25 MG tablet Take 25 mg by mouth daily.   No facility-administered encounter medications on file as of 03/12/2017.     PAST MEDICAL HISTORY:   Past Medical History:  Diagnosis Date  . Anxiety   . Atrial fibrillation [facility])    Diagnosed February 2016  . Chronic back pain   . Depression   . Essential hypertension   . GERD (gastroesophageal reflux disease)   . H. pylori infection 07/2011  . Hiatal hernia   . Memory deficits   . Migraines   . Tubulovillous adenoma of colon 05/23/10  . Type 2 diabetes mellitus ([place])     PAST SURGICAL HISTORY:   Past Surgical History:  Procedure Laterality Date  . CHOLECYSTECTOMY  2006  . COLONOSCOPY  05/15/10   Rourk-friable anal canal, 1cm pedunculated TVA  mid-sigmoid , diminutive adjacent polyp ablated  . ESOPHAGOGASTRODUODENOSCOPY  07/20/2011   Dr. Gala Romney- normal esophagus-s/p maloney dialation, small hiatal hernia, hpylori +  . UMBILICAL HERNIA REPAIR  2006    SOCIAL HISTORY:   Social History   Socioeconomic History  . Marital status: Divorced    Spouse name: Not on file  . Number of children: 2  . Years of education: Not on file  . Highest education level: Not on file  Social Needs  .  Financial resource strain: Not on file  . Food insecurity - worry: Not on file  . Food insecurity - inability: Not on file  . Transportation needs - medical: Not on file  . Transportation needs - non-medical: Not on file  Occupational History  . Occupation: Disabled    Employer: NOT EMPLOYED  Tobacco Use  . Smoking status: Current Every Day Smoker    Packs/day: 1.50    Years: 40.00    Pack years: 60.00     Types: Cigarettes    Start date: 05/19/1968  . Smokeless tobacco: Never Used  Substance and Sexual Activity  . Alcohol use: Yes    Alcohol/week: 0.0 oz    Comment: 1-2 a year  . Drug use: No  . Sexual activity: Not on file  Other Topics Concern  . Not on file  Social History Narrative   Lives w/ mother    FAMILY HISTORY:   Family Status  Relation Name Status  . Mother  Alive  . Father  Alive  . Brother  Alive       x3  . Sister  Alive  . Brother  (Not Specified)    ROS:  A complete 10 system review of systems was obtained and was unremarkable apart from what is mentioned above.  PHYSICAL EXAMINATION:    VITALS:  There were no vitals filed for this visit.  GEN:  The patient appears stated age and is in NAD. HEENT:  Normocephalic, atraumatic.  The mucous membranes are moist. The superficial temporal arteries are without ropiness or tenderness. CV:  RRR Lungs:  CTAB Neck/HEME:  There are no carotid bruits bilaterally.  Neurological examination:  Orientation: The patient is alert and oriented x3. Fund of knowledge is appropriate.  Recent and remote memory are intact.  Attention and concentration are normal.    Able to name objects and repeat phrases. Cranial nerves: There is good facial symmetry. Pupils are equal round and reactive to light bilaterally. Fundoscopic exam reveals clear margins bilaterally. Extraocular muscles are intact. The visual fields are full to confrontational testing. The speech is fluent and clear. Soft palate rises symmetrically and there is no tongue deviation. Hearing is intact to conversational tone. Sensation: Sensation is intact to light and pinprick throughout (facial, trunk, extremities). Vibration is intact at the bilateral big toe. There is no extinction with double simultaneous stimulation. There is no sensory dermatomal level identified. Motor: Strength is 5/5 in the bilateral upper and lower extremities.   Shoulder shrug is equal and  symmetric.  There is no pronator drift. Deep tendon reflexes: Deep tendon reflexes are 2/4 at the bilateral biceps, triceps, brachioradialis, patella and achilles. Plantar responses are downgoing bilaterally.  Movement examination: Tone: There is ***tone in the bilateral upper extremities.  The tone in the lower extremities is ***.  Abnormal movements: *** Coordination:  There is *** decremation with RAM's, *** Gait and Station: The patient has *** difficulty arising out of a deep-seated chair without the use of the hands. The patient's stride length is ***.  The patient has a *** pull test.      ASSESSMENT/PLAN:  ***  Cc:  Redmond School, MD

## 2017-03-11 ENCOUNTER — Ambulatory Visit (HOSPITAL_COMMUNITY): Payer: Medicare HMO

## 2017-03-12 ENCOUNTER — Ambulatory Visit: Payer: Medicare HMO | Admitting: Neurology

## 2017-03-15 ENCOUNTER — Ambulatory Visit (HOSPITAL_COMMUNITY)
Admission: RE | Admit: 2017-03-15 | Discharge: 2017-03-15 | Disposition: A | Discharge status: Home / Self Care | Payer: Medicare HMO | Source: Ambulatory Visit | Attending: Internal Medicine | Admitting: Internal Medicine

## 2017-03-15 DIAGNOSIS — M48061 Spinal stenosis, lumbar region without neurogenic claudication: Secondary | ICD-10-CM | POA: Diagnosis not present

## 2017-03-15 DIAGNOSIS — M47816 Spondylosis without myelopathy or radiculopathy, lumbar region: Secondary | ICD-10-CM | POA: Insufficient documentation

## 2017-03-15 DIAGNOSIS — M5126 Other intervertebral disc displacement, lumbar region: Secondary | ICD-10-CM | POA: Insufficient documentation

## 2017-03-15 DIAGNOSIS — M5417 Radiculopathy, lumbosacral region: Secondary | ICD-10-CM | POA: Diagnosis not present

## 2017-03-15 DIAGNOSIS — M549 Dorsalgia, unspecified: Secondary | ICD-10-CM | POA: Diagnosis present

## 2017-03-20 DIAGNOSIS — Z6826 Body mass index (BMI) 26.0-26.9, adult: Secondary | ICD-10-CM | POA: Diagnosis not present

## 2017-03-20 DIAGNOSIS — I4891 Unspecified atrial fibrillation: Secondary | ICD-10-CM | POA: Diagnosis not present

## 2017-03-20 DIAGNOSIS — M1991 Primary osteoarthritis, unspecified site: Secondary | ICD-10-CM | POA: Diagnosis not present

## 2017-03-20 DIAGNOSIS — J329 Chronic sinusitis, unspecified: Secondary | ICD-10-CM | POA: Diagnosis not present

## 2017-03-20 DIAGNOSIS — I700 Atherosclerosis of aorta: Secondary | ICD-10-CM | POA: Diagnosis not present

## 2017-03-20 DIAGNOSIS — D72829 Elevated white blood cell count, unspecified: Secondary | ICD-10-CM | POA: Diagnosis not present

## 2017-03-20 DIAGNOSIS — E1165 Type 2 diabetes mellitus with hyperglycemia: Secondary | ICD-10-CM | POA: Diagnosis not present

## 2017-03-22 NOTE — Progress Notes (Signed)
Garrett Clements was seen today in the movement disorders clinic for neurologic consultation at the request of Redmond School, MD.  The consultation is for the evaluation of "abnormal tone" and "eval for movement disorder."  The records that were made available to me were reviewed but no records available about abnormal tone/movement abnormalities.  Pts daughter in law states that when patient was examined he was told he had low tone in the arm.  Pt c/o paresthesias in the legs - "feels like someone is sticking needles in the legs and feet."  That has been going on 5-10 years.  He has been told he is DM for many years.  That didn't used to be well controlled but it is much better.  Daughter in law states that his A1C is 9.0 or so but it was much worse.  He staggers a little with ambulation.  Told he had spinal stenosis in the back based on recent MRI of the lumbar spine and awaiting appt from neurosx.  MRI of the lumbar spine was completed on March 15, 2017.  I reviewed this.  There was evidence of spondylosis at L3/L4 with moderately severe central canal stenosis at this level.  He takes a compounded cream of lidocaine, gabapentin and it doesn't help.  It is very expensive.  He tried gabapentin in the past, but it caused nausea and he discontinued the medication  Admits to occasional headache.   ALLERGIES:  No Known Allergies  CURRENT MEDICATIONS:  Outpatient Encounter Medications as of 03/26/2017  Medication Sig  . atorvastatin (LIPITOR) 20 MG tablet Take 20 mg by mouth every morning.  . clonazePAM (KLONOPIN) 0.5 MG tablet Take 0.5 mg by mouth daily as needed for anxiety.  Marland Kitchen glipiZIDE (GLUCOTROL) 10 MG tablet Take 10 mg by mouth every morning.  Marland Kitchen lisinopril (PRINIVIL,ZESTRIL) 5 MG tablet Take 5 mg by mouth every morning.  . loperamide (IMODIUM A-D) 2 MG tablet Take 1 tablet (2 mg total) by mouth 4 (four) times daily as needed for diarrhea or loose stools.  . metFORMIN (GLUCOPHAGE) 1000 MG  tablet Take 1,000 mg by mouth 2 (two) times daily with a meal.   . Omega-3 Fatty Acids (OMEGA 3 500) 500 MG CAPS Take by mouth daily.  . promethazine (PHENERGAN) 25 MG tablet Take 1 tablet (25 mg total) by mouth every 6 (six) hours as needed.  . rivaroxaban (XARELTO) 20 MG TABS tablet Take 1 tablet (20 mg total) by mouth daily with supper.  . sertraline (ZOLOFT) 25 MG tablet Take 25 mg by mouth daily.   No facility-administered encounter medications on file as of 03/26/2017.     PAST MEDICAL HISTORY:   Past Medical History:  Diagnosis Date  . Anxiety   . Atrial fibrillation [facility])    Diagnosed February 2016  . Chronic back pain   . Depression   . Essential hypertension   . GERD (gastroesophageal reflux disease)   . H. pylori infection 07/2011  . Hiatal hernia   . Memory deficits   . Migraines   . Tubulovillous adenoma of colon 05/23/10  . Type 2 diabetes mellitus ([place])     PAST SURGICAL HISTORY:   Past Surgical History:  Procedure Laterality Date  . CHOLECYSTECTOMY  2006  . COLONOSCOPY  05/15/10   Rourk-friable anal canal, 1cm pedunculated TVA  mid-sigmoid , diminutive adjacent polyp ablated  . ESOPHAGOGASTRODUODENOSCOPY  07/20/2011   Dr. Gala Romney- normal esophagus-s/p maloney dialation, small hiatal hernia, hpylori +  .  UMBILICAL HERNIA REPAIR  2006    SOCIAL HISTORY:   Social History   Socioeconomic History  . Marital status: Divorced    Spouse name: Not on file  . Number of children: 2  . Years of education: Not on file  . Highest education level: Not on file  Social Needs  . Financial resource strain: Not on file  . Food insecurity - worry: Not on file  . Food insecurity - inability: Not on file  . Transportation needs - medical: Not on file  . Transportation needs - non-medical: Not on file  Occupational History  . Occupation: Disabled    Employer: NOT EMPLOYED  Tobacco Use  . Smoking status: Current Every Day Smoker    Packs/day: 2.00    Years: 40.00    Pack  years: 80.00    Types: Cigarettes    Start date: 05/19/1968  . Smokeless tobacco: Never Used  Substance and Sexual Activity  . Alcohol use: Yes    Alcohol/week: 0.0 oz    Comment: 1-2 a year  . Drug use: No  . Sexual activity: Not on file  Other Topics Concern  . Not on file  Social History Narrative   Lives w/ mother    FAMILY HISTORY:   Family Status  Relation Name Status  . Mother  Alive  . Father  Alive  . Brother  Alive       x3  . Sister  Alive  . Brother  (Not Specified)    ROS: Patient complains of rather diffuse pain.  It happens in the arms, back, legs.  He admits to some depression.  No suicidal or homicidal ideation.  A complete 10 system review of systems was obtained and was unremarkable apart from what is mentioned above.  PHYSICAL EXAMINATION:    VITALS:   Vitals:   03/26/17 0941  Weight: 182 lb 8 oz (82.8 kg)  Height: 5\' 10"  (1.778 m)    GEN:  The patient appears stated age and is in NAD. HEENT:  Normocephalic, atraumatic.  The mucous membranes are moist. The superficial temporal arteries are without ropiness or tenderness. CV:  RRR Lungs:  CTAB Neck/HEME:  There are no carotid bruits bilaterally.  Neurological examination:  Orientation: The patient is alert and oriented x3. Fund of knowledge is appropriate.  Recent and remote memory are intact.  Attention and concentration are normal.    Able to name objects and repeat phrases. Cranial nerves: There is good facial symmetry. Pupils are equal round and reactive to light bilaterally. Fundoscopic exam reveals clear margins bilaterally. There is extropia on the L with strabismus (chronic as child per patient).   The visual fields are full to confrontational testing. The speech is fluent and clear. Soft palate rises symmetrically and there is no tongue deviation. Hearing is intact to conversational tone. Sensation: Sensation is intact to light and pinprick throughout (facial, trunk, extremities). Vibration  is intact at the bilateral big toe. There is no extinction with double simultaneous stimulation. There is no sensory dermatomal level identified. Motor: Strength is 5/5 in the bilateral upper extremities, with the exception of the intrinsic muscles of the hand and there is marked decrease in finger abduction.  Strength in APB bilaterally is good.  Manual motor testing in the lower extremities is somewhat limited by pain, but is at least 5-/5.  There is decreased grip strength bilaterally.   Shoulder shrug is equal and symmetric.  There is no pronator drift. Deep tendon reflexes:  Deep tendon reflexes are 2/4 at the bilateral biceps, triceps, brachioradialis, 3+/4 at the bilateral patella with cross adductor reflexes present and 1/4 at the bilateral achilles. Plantar responses are downgoing bilaterally.  Movement examination: Tone: The patient has trouble relaxing when asked to and has a component of paratonia.  Once significantly distracted he is able to relax and tone becomes normal.   Abnormal movements: There is very rare right upper extremity resting tremor. Coordination:  There is no decremation with RAM's, with any form of RAMS, including alternating supination and pronation of the forearm, hand opening and closing, finger taps, heel taps and toe taps.  He does complain about pain in his hands with doing most rapid alternating movements with the hands, which limits ability to assess rapid alternating movements in the hands. Gait and Station: The patient has mild difficulty arising out of a deep-seated chair without the use of the hands, mostly attributed to back pain.  The patient's stride length is decreased, but gait is very wide-based and he holds his back while walking.  He is mildly unsteady.    ASSESSMENT/PLAN:  1.  Peripheral neuropathy  -The patient has clinical examination evidence of a diffuse peripheral neuropathy, which certainly can affect gait and balance.  We discussed safety  associated with peripheral neuropathy.  We discussed balance therapy and the importance of ambulatory assistive device for balance assistance.  His neuropathy is most certainly due to uncontrolled diabetes, which he is working to control much better.  If not already done so, I would recommend, in addition to his diabetes labs, B12,, SPEP/UPEP with immunofixation.  He can have these done at his next primary care visit.  -He is having significant difficulty opening jars, which certainly may be due to peripheral neuropathy affecting hands.  However, he may have some coexisting carpal tunnel as well.  We decided to proceed with EMG.  -He asked about options for treating neuropathy.  He has already tried gabapentin.  We discussed Lyrica and Cymbalta.  Cymbalta actually may be a good option for him given his pain and depression.  He is going to talk to his primary care physician about these options given that he really is not going to be following here regularly.  2.  Tremor  -He does have some mild tremor, but does not meet criteria for Parkinson's disease.  He and I discussed this today.  He has a significant difficulty relaxing, but once I was able to get him to do so, tone was normal.  I decided to follow him up in a year, and less new neurologic issues arise and he can follow-up here before that   3.  Tobacco abuse  -Currently smoking 2.0 packs/day    - Patient was informed of the dangers of tobacco abuse including stroke, cancer, and MI, as well as benefits of tobacco cessation.  - Patient not willing to quit at this time.  - Approximately 3 mins were spent counseling patient cessation techniques. We discussed various methods to help quit smoking, including deciding on a date to quit, joining a support group, pharmacological agents- nicotine gum/patch/lozenges, chantix,   - I will reassess his progress at future visit.  4.  Will call him with results of EMG.  Will see him in 1 year.  Much greater than  50% of this visit was spent in counseling and coordinating care.  Total face to face time:  45 min.  This was independent of tobacco counseling time   Cc:  Redmond School, MD

## 2017-03-26 ENCOUNTER — Encounter: Payer: Self-pay | Admitting: Internal Medicine

## 2017-03-26 ENCOUNTER — Ambulatory Visit: Payer: Medicare HMO | Admitting: Neurology

## 2017-03-26 ENCOUNTER — Encounter: Payer: Self-pay | Admitting: Neurology

## 2017-03-26 VITALS — Ht 70.0 in | Wt 182.5 lb

## 2017-03-26 DIAGNOSIS — E1142 Type 2 diabetes mellitus with diabetic polyneuropathy: Secondary | ICD-10-CM | POA: Diagnosis not present

## 2017-03-26 DIAGNOSIS — R251 Tremor, unspecified: Secondary | ICD-10-CM | POA: Diagnosis not present

## 2017-03-26 DIAGNOSIS — Z720 Tobacco use: Secondary | ICD-10-CM | POA: Diagnosis not present

## 2017-03-26 DIAGNOSIS — G5601 Carpal tunnel syndrome, right upper limb: Secondary | ICD-10-CM | POA: Diagnosis not present

## 2017-03-26 DIAGNOSIS — F17210 Nicotine dependence, cigarettes, uncomplicated: Secondary | ICD-10-CM | POA: Diagnosis not present

## 2017-04-02 DIAGNOSIS — M48062 Spinal stenosis, lumbar region with neurogenic claudication: Secondary | ICD-10-CM | POA: Diagnosis not present

## 2017-04-02 DIAGNOSIS — I739 Peripheral vascular disease, unspecified: Secondary | ICD-10-CM | POA: Diagnosis not present

## 2017-04-08 ENCOUNTER — Ambulatory Visit (HOSPITAL_COMMUNITY): Payer: Medicare HMO | Admitting: Internal Medicine

## 2017-04-09 ENCOUNTER — Encounter (HOSPITAL_COMMUNITY): Payer: Self-pay | Admitting: Internal Medicine

## 2017-04-09 ENCOUNTER — Other Ambulatory Visit: Payer: Self-pay

## 2017-04-09 ENCOUNTER — Inpatient Hospital Stay (HOSPITAL_COMMUNITY): Payer: Medicare HMO

## 2017-04-09 ENCOUNTER — Inpatient Hospital Stay (HOSPITAL_COMMUNITY): Payer: Medicare HMO | Attending: Internal Medicine | Admitting: Internal Medicine

## 2017-04-09 VITALS — BP 115/79 | HR 74 | Temp 97.7°F | Resp 18 | Ht 70.0 in | Wt 180.4 lb

## 2017-04-09 DIAGNOSIS — D72829 Elevated white blood cell count, unspecified: Secondary | ICD-10-CM

## 2017-04-09 DIAGNOSIS — Z7901 Long term (current) use of anticoagulants: Secondary | ICD-10-CM | POA: Insufficient documentation

## 2017-04-09 DIAGNOSIS — F17210 Nicotine dependence, cigarettes, uncomplicated: Secondary | ICD-10-CM | POA: Diagnosis not present

## 2017-04-09 DIAGNOSIS — D473 Essential (hemorrhagic) thrombocythemia: Secondary | ICD-10-CM | POA: Insufficient documentation

## 2017-04-09 DIAGNOSIS — D75839 Thrombocytosis, unspecified: Secondary | ICD-10-CM

## 2017-04-09 DIAGNOSIS — E119 Type 2 diabetes mellitus without complications: Secondary | ICD-10-CM | POA: Insufficient documentation

## 2017-04-09 DIAGNOSIS — I10 Essential (primary) hypertension: Secondary | ICD-10-CM | POA: Insufficient documentation

## 2017-04-09 LAB — COMPREHENSIVE METABOLIC PANEL
ALK PHOS: 61 U/L (ref 38–126)
ALT: 17 U/L (ref 17–63)
ANION GAP: 11 (ref 5–15)
AST: 19 U/L (ref 15–41)
Albumin: 4.4 g/dL (ref 3.5–5.0)
BILIRUBIN TOTAL: 1.2 mg/dL (ref 0.3–1.2)
BUN: 18 mg/dL (ref 6–20)
CALCIUM: 9.8 mg/dL (ref 8.9–10.3)
CO2: 26 mmol/L (ref 22–32)
Chloride: 97 mmol/L — ABNORMAL LOW (ref 101–111)
Creatinine, Ser: 0.89 mg/dL (ref 0.61–1.24)
GFR calc non Af Amer: >60 mL/min (ref >60)
Glucose, Bld: 149 mg/dL — ABNORMAL HIGH (ref 65–99)
Potassium: 4.5 mmol/L (ref 3.5–5.1)
SODIUM: 134 mmol/L — AB (ref 135–145)
Total Protein: 7.8 g/dL (ref 6.5–8.1)

## 2017-04-09 LAB — FERRITIN: Ferritin: 236 ng/mL (ref 24–336)

## 2017-04-09 LAB — CBC WITH DIFFERENTIAL/PLATELET
Basophils Absolute: 0.1 10*3/uL (ref 0.0–0.1)
Basophils Relative: 0 %
EOS ABS: 0.3 10*3/uL (ref 0.0–0.7)
Eosinophils Relative: 2 %
HCT: 44.7 % (ref 39.0–52.0)
HEMOGLOBIN: 15.2 g/dL (ref 13.0–17.0)
Lymphocytes Relative: 27 %
Lymphs Abs: 3.8 10*3/uL (ref 0.7–4.0)
MCH: 29.6 pg (ref 26.0–34.0)
MCHC: 34 g/dL (ref 30.0–36.0)
MCV: 87 fL (ref 78.0–100.0)
MONO ABS: 1 10*3/uL (ref 0.1–1.0)
MONOS PCT: 7 %
NEUTROS PCT: 64 %
Neutro Abs: 9.4 10*3/uL — ABNORMAL HIGH (ref 1.7–7.7)
Platelets: 332 10*3/uL (ref 150–400)
RBC: 5.14 MIL/uL (ref 4.22–5.81)
RDW: 14.2 % (ref 11.5–15.5)
WBC: 14.5 10*3/uL — ABNORMAL HIGH (ref 4.0–10.5)

## 2017-04-09 LAB — LACTATE DEHYDROGENASE: LDH: 117 U/L (ref 98–192)

## 2017-04-09 LAB — C-REACTIVE PROTEIN

## 2017-04-09 LAB — SEDIMENTATION RATE: SED RATE: 2 mm/h (ref 0–16)

## 2017-04-09 NOTE — Patient Instructions (Signed)
[place] at [facility] Discharge Instructions  RECOMMENDATIONS MADE BY THE CONSULTANT AND ANY TEST RESULTS WILL BE SENT TO YOUR REFERRING PHYSICIAN.  You were seen today by Dr. Zoila Shutter We will do blood work today  Follow up in 2 weeks for review of all labs  A Lung Cancer screening program is recommended since you are a smoker If you decide to quit smoking please let us help you with that journey.  Thank you for choosing [place] at [facility] to provide your oncology and hematology care.  To afford each patient quality time with our provider, please arrive at least 15 minutes before your scheduled appointment time.    If you have a lab appointment with the [facility] please come in thru the  Main Entrance and check in at the main information desk  You need to re-schedule your appointment should you arrive 10 or more minutes late.  We strive to give you quality time with our providers, and arriving late affects you and other patients whose appointments are after yours.  Also, if you no show three or more times for appointments you may be dismissed from the clinic at the providers discretion.     Again, thank you for choosing [facility].  Our hope is that these requests will decrease the amount of time that you wait before being seen by our physicians.       _____________________________________________________________  Should you have questions after your visit to [facility], please contact our office at [phone] between the hours of 8:30 a.m. and 4:30 p.m.  Voicemails left after 4:30 p.m. will not be returned until the following business day.  For prescription refill requests, have your pharmacy contact our office.       Resources For Cancer Patients and their Caregivers ? American Cancer Society: Can assist with transportation, wigs, general needs, runs Look Good Feel Better.         971-483-8524 ? Cancer Care: Provides financial assistance, online support groups, medication/co-pay assistance.  1-800-813-HOPE (787)499-4610) ? [place] Assists [place] cancer patients and their families through emotional , educational and financial support.  903-848-8355 ? [place] DSS Where to apply for food stamps, Medicaid and utility assistance. 854-610-0311 ? RCATS: Transportation to medical appointments. 872-436-3842 ? Social Security Administration: May apply for disability if have a Stage IV cancer. [phone] [phone] [phone] ? [employer], Disability and Transit Services: Assists with nutrition, care and transit needs. [place] Support Programs: @10RELATIVEDAYS @ > Cancer Support Group  2nd Tuesday of the month 1pm-2pm, Journey Room  > Creative Journey  3rd Tuesday of the month 1130am-1pm, Journey Room  > Look Good Feel Better  1st Wednesday of the month 10am-12 noon, Journey Room (Call [place] to register [phone])

## 2017-04-10 ENCOUNTER — Other Ambulatory Visit (HOSPITAL_COMMUNITY): Payer: Self-pay | Admitting: Neurosurgery

## 2017-04-10 DIAGNOSIS — I739 Peripheral vascular disease, unspecified: Secondary | ICD-10-CM

## 2017-04-11 ENCOUNTER — Encounter (HOSPITAL_COMMUNITY): Payer: Medicare HMO

## 2017-04-11 DIAGNOSIS — E1144 Type 2 diabetes mellitus with diabetic amyotrophy: Secondary | ICD-10-CM | POA: Diagnosis not present

## 2017-04-11 DIAGNOSIS — I4891 Unspecified atrial fibrillation: Secondary | ICD-10-CM | POA: Diagnosis not present

## 2017-04-11 DIAGNOSIS — M48062 Spinal stenosis, lumbar region with neurogenic claudication: Secondary | ICD-10-CM | POA: Diagnosis not present

## 2017-04-15 LAB — BCR-ABL1, CML/ALL, PCR, QUANT

## 2017-04-16 ENCOUNTER — Telehealth: Payer: Self-pay | Admitting: Neurology

## 2017-04-16 ENCOUNTER — Ambulatory Visit (INDEPENDENT_AMBULATORY_CARE_PROVIDER_SITE_OTHER): Payer: Medicare HMO | Admitting: Neurology

## 2017-04-16 DIAGNOSIS — E1142 Type 2 diabetes mellitus with diabetic polyneuropathy: Secondary | ICD-10-CM | POA: Diagnosis not present

## 2017-04-16 DIAGNOSIS — G5601 Carpal tunnel syndrome, right upper limb: Secondary | ICD-10-CM

## 2017-04-16 NOTE — Procedures (Signed)
[facility] Neurology  [place], [place]  [place], [place] [address] Tel: [phone] Fax:  [phone] Test Date:  04/16/2017  Patient: Garrett Clements DOB: [birth year] Physician: Narda Amber, DO  Sex: Male Height: 5\' 10"  Ref Phys: Alonza Bogus, DO  ID#: [id number] Temp: 32.0C Technician:    Patient Complaints: This is a 62 year old man referred for evaluation of gait imbalance and diffuse pain.  NCV & EMG Findings: Extensive electrodiagnostic testing of the right upper and lower extremity shows:  1. Right median sensory response shows reduced amplitude (8.7 V). Right mixed palmer sensory response shows prolonged latency. Right ulnar, sural, and superficial peroneal sensory responses are within normal limits. 2. Right median motor response shows mildly prolonged latency (4.1 ms) and normal amplitude. Right ulnar, peroneal, and tibial motor responses are within normal limits. 3. Right tibial H reflex study is within normal limits. 4. There is a generalized pattern of incomplete motor unit activation, as noted by variable firing pattern and recruitment; these findings are most likely due to pain or poor effort. Chronic motor axon loss changes are isolated to the tibialis anterior muscle. Proximal and deep muscles were unable to be tested as the patient is on anticoagulation therapy.  Impression: 1. Right median neuropathy at or distal to the wrist, consistent with the clinical diagnosis of carpal tunnel syndrome. Overall, these findings are moderate in degree electrically 2. Probable chronic L5 radiculopathy affecting the right lower extremity. Correlate clinically. 3. There is no evidence of a large fiber sensorimotor polyneuropathy.   ___________________________ Narda Amber, DO    Nerve Conduction Studies Anti Sensory Summary Table   Site NR Peak (ms) Norm Peak (ms) P-T Amp (V) Norm P-T Amp  Right Median Anti Sensory (2nd Digit)  Wrist    3.7 <3.8 8.7 >10  Right Sup  Peroneal Anti Sensory (Ant Lat Mall)  12 cm    3.3 <4.6 6.8 >3  Right Sural Anti Sensory (Lat Mall)  Calf    3.8 <4.6 12.2 >3  Right Ulnar Anti Sensory (5th Digit)  Wrist    2.8 <3.2 6.9 >5   Motor Summary Table   Site NR Onset (ms) Norm Onset (ms) O-P Amp (mV) Norm O-P Amp Site1 Site2 Delta-0 (ms) Dist (cm) Vel (m/s) Norm Vel (m/s)  Right Median Motor (Abd Poll Brev)  Wrist    4.1 <4.0 11.5 >5 Elbow Wrist 5.1 27.0 53 >50  Elbow    9.2  11.5         Right Peroneal Motor (Ext Dig Brev)  Ankle    4.8 <6.0 3.3 >2.5 B Fib Ankle 8.5 40.0 47 >40  B Fib    13.3  3.0  Poplt B Fib 2.2 10.0 45 >40  Poplt    15.5  2.7         Right Tibial Motor (Abd Hall Brev)  Ankle    4.7 <6.0 12.1 >4 Knee Ankle 9.6 45.0 47 >40  Knee    14.3  7.4         Right Ulnar Motor (Abd Dig Minimi)  Wrist    2.6 <3.1 9.0 >7 B Elbow Wrist 4.4 26.0 59 >50  B Elbow    7.0  8.8  A Elbow B Elbow 2.0 10.0 50 >50  A Elbow    9.0  8.7          Comparison Summary Table   Site NR Peak (ms) Norm Peak (ms) P-T Amp (V) Site1 Site2 Delta-P (ms) Norm  Delta (ms)  Right Median/Ulnar Palm Comparison (Wrist - 8cm)  Median Palm    2.3 <2.2 22.9 Median Palm Ulnar Palm 0.8   Ulnar Palm    1.5 <2.2 7.9       H Reflex Studies   NR H-Lat (ms) Lat Norm (ms) L-R H-Lat (ms)  Right Tibial (Gastroc)     34.42 <35    EMG   Side Muscle Ins Act Fibs Psw Fasc Number Recrt Dur Dur. Amp Amp. Poly Poly. Comment  Right 1stDorInt Nml Nml Nml Nml 1- Mod-V Nml Nml Nml Nml Nml Nml N/A  Right Abd Poll Brev Nml Nml Nml Nml 1- Mod-V Nml Nml Nml Nml Nml Nml N/A  Right PronatorTeres Nml Nml Nml Nml 1- Mod-V Nml Nml Nml Nml Nml Nml N/A  Right Biceps Nml Nml Nml Nml 1- Mod-V Nml Nml Nml Nml Nml Nml N/A  Right Triceps Nml Nml Nml Nml 1- Mod-V Nml Nml Nml Nml Nml Nml N/A  Right Deltoid Nml Nml Nml Nml 1- Mod-V Nml Nml Nml Nml Nml Nml N/A  Right AntTibialis Nml Nml Nml Nml 1- Mod-R Some 1+ Some 1+ Some 1+ N/A  Right Gastroc Nml Nml Nml Nml 2- Mod-V Nml  Nml Nml Nml Nml Nml N/A  Right RectFemoris Nml Nml Nml Nml Nml Nml Nml Nml Nml Nml Nml Nml N/A      Waveforms:

## 2017-04-16 NOTE — Telephone Encounter (Signed)
-----   Message from [place], DO sent at 04/16/2017  1:11 PM EST ----- Let pt know that EMG demonstrated moderate R carpal tunnel.  If he wants referral to hand surgeon, we can refer.  Should wear wrist splint.

## 2017-04-16 NOTE — Telephone Encounter (Signed)
Left message on machine for patient to call back.

## 2017-04-17 MED ORDER — WRIST SPLINT/COCK-UP/RIGHT L MISC: 1.0000 | Freq: Every day | 0 refills | Status: DC

## 2017-04-17 NOTE — Telephone Encounter (Signed)
Patient's daughter-in-law called back and she was made aware of results/options. They would like to try wrist splint first. RX mailed to them per their request.

## 2017-04-22 ENCOUNTER — Ambulatory Visit (HOSPITAL_COMMUNITY)
Admission: RE | Admit: 2017-04-22 | Discharge: 2017-04-22 | Disposition: A | Discharge status: Home / Self Care | Payer: Medicare HMO | Source: Ambulatory Visit | Attending: Neurosurgery | Admitting: Neurosurgery

## 2017-04-22 DIAGNOSIS — F17200 Nicotine dependence, unspecified, uncomplicated: Secondary | ICD-10-CM | POA: Diagnosis not present

## 2017-04-22 DIAGNOSIS — I739 Peripheral vascular disease, unspecified: Secondary | ICD-10-CM

## 2017-04-22 DIAGNOSIS — E1151 Type 2 diabetes mellitus with diabetic peripheral angiopathy without gangrene: Secondary | ICD-10-CM | POA: Diagnosis not present

## 2017-04-22 DIAGNOSIS — E785 Hyperlipidemia, unspecified: Secondary | ICD-10-CM | POA: Diagnosis not present

## 2017-04-22 NOTE — Progress Notes (Signed)
Diagnosis Leukocytosis, unspecified type - Plan: CBC with Differential/Platelet, Comprehensive metabolic panel, Lactate dehydrogenase, Sedimentation rate, C-reactive protein, BCR-ABL1, CML/ALL, PCR, QUANT, Ferritin, Ambulatory Referral for Lung Cancer Screening  Thrombocytosis ([place]) - Plan: CBC with Differential/Platelet, Comprehensive metabolic panel, Lactate dehydrogenase, Sedimentation rate, C-reactive protein, BCR-ABL1, CML/ALL, PCR, QUANT, Ferritin, Ambulatory Referral for Lung Cancer Screening  Staging Cancer Staging No matching staging information was found for the patient.  Assessment and Plan:  1.  Thrombocytosis.  62 year old male referred for thrombocytosis.  He has a history of smoking.  Labs done 12/2016 showed WBC 17 Hb 17 plts 346,000.  He denies any fevers, chills, night sweats and has noted no adenopathy.  He is seen today for consultation due to thrombocytosis.    Labs done today show WBC 14 HB 15 plts 333,000.  Sedrate, CRP WNL.  Ferritin is 236.  BCR/ABL is negative.   I suspect this is a normal varient. PLT count today is WNL.    He had CT of abdomen and pelvis done 01/23/2017 that was negative.  He will RTC in 2 weeks to go over lab results.   2.  Smoking.  Cessation is recommended.  He will be referred to lung cancer screening program.    3.  Atrial Fibrillation.  He is on Xarelto.  Continue to follow-up with PCP and cardiology.    4.  Hypertension. BP is 115/79.  Continue to follow-up with PCP.      HPI: 62 year old male referred for thrombocytosis.  He has a history of smoking.  Labs done 12/2016 showed WBC 17 Hb 17 plts 346,000.  He denies any fevers, chills, night sweats and has noted no adenopathy.  He is seen today for consultation due to thrombocytosis.    Problem List Patient Active Problem List   Diagnosis Date Noted  . Essential hypertension [I10] 04/19/2014  . Persistent atrial fibrillation ([place]) [I48.1] 04/19/2014  . Type 2 diabetes mellitus ([place])  [E11.9] 04/19/2014  . GERD (gastroesophageal reflux disease) [K21.9] 06/29/2011    Past Medical History Past Medical History:  Diagnosis Date  . Anxiety   . Atrial fibrillation [facility])    Diagnosed February 2016  . Chronic back pain   . Depression   . Essential hypertension   . GERD (gastroesophageal reflux disease)   . H. pylori infection 07/2011  . Hiatal hernia   . Memory deficits   . Migraines   . Tubulovillous adenoma of colon 05/23/10  . Type 2 diabetes mellitus [facility])     Past Surgical History Past Surgical History:  Procedure Laterality Date  . CHOLECYSTECTOMY  2006  . COLONOSCOPY  05/15/10   Rourk-friable anal canal, 1cm pedunculated TVA  mid-sigmoid , diminutive adjacent polyp ablated  . ESOPHAGOGASTRODUODENOSCOPY  07/20/2011   Dr. Gala Romney- normal esophagus-s/p maloney dialation, small hiatal hernia, hpylori +  . UMBILICAL HERNIA REPAIR  2006    Family History Family History  Problem Relation Age of Onset  . Diabetes Mother   . Osteoporosis Father   . Ulcers Father   . Diabetes Brother   . Diabetes Brother      Social History  reports that he has been smoking cigarettes.  He started smoking about 48 years ago. He has a 90.00 pack-year smoking history. he has never used smokeless tobacco. He reports that he does not drink alcohol or use drugs.  Medications  Current Outpatient Medications:  .  atorvastatin (LIPITOR) 20 MG tablet, Take 20 mg by mouth every morning., Disp: ,  Rfl:  .  clonazePAM (KLONOPIN) 0.5 MG tablet, Take 0.5 mg by mouth daily as needed for anxiety., Disp: , Rfl:  .  glipiZIDE (GLUCOTROL) 10 MG tablet, Take 10 mg by mouth every morning., Disp: , Rfl: 2 .  lisinopril (PRINIVIL,ZESTRIL) 5 MG tablet, Take 5 mg by mouth every morning., Disp: , Rfl:  .  loperamide (IMODIUM A-D) 2 MG tablet, Take 1 tablet (2 mg total) by mouth 4 (four) times daily as needed for diarrhea or loose stools., Disp: 30 tablet, Rfl: 0 .  metFORMIN (GLUCOPHAGE) 500 MG tablet,  TAKE 2 TABLETS BY MOUTH TWICE DAILY FOR 90 DAYS, Disp: , Rfl: 0 .  Omega-3 Fatty Acids (OMEGA 3 500) 500 MG CAPS, Take by mouth daily., Disp: , Rfl:  .  promethazine (PHENERGAN) 25 MG tablet, Take 1 tablet (25 mg total) by mouth every 6 (six) hours as needed., Disp: 12 tablet, Rfl: 1 .  rivaroxaban (XARELTO) 20 MG TABS tablet, Take 1 tablet (20 mg total) by mouth daily with supper., Disp: 30 tablet, Rfl: 6 .  sertraline (ZOLOFT) 25 MG tablet, Take 25 mg by mouth daily., Disp: , Rfl:  .  Elastic Bandages & Supports (WRIST SPLINT/COCK-UP/RIGHT L) MISC, 1 Device by Does not apply route daily. g26.01, Disp: 1 each, Rfl: 0  Allergies Patient has no known allergies.  Review of Systems Review of Systems - Oncology ROS as per HPI otherwise 12 point ROS is negative.   Physical Exam  Vitals Wt Readings from Last 3 Encounters:  04/09/17 180 lb 6.4 oz (81.8 kg)  03/26/17 182 lb 8 oz (82.8 kg)  01/31/17 188 lb (85.3 kg)   Temp Readings from Last 3 Encounters:  04/09/17 97.7 F (36.5 C) (Oral)  01/23/17 97.8 F (36.6 C) (Oral)  06/29/11 97.9 F (36.6 C) (Temporal)   BP Readings from Last 3 Encounters:  04/09/17 115/79  01/31/17 118/78  01/24/17 108/74   Pulse Readings from Last 3 Encounters:  04/09/17 74  01/31/17 (!) 108  01/24/17 (!) 59    Constitutional: Well-developed, well-nourished, and in no distress.   HENT: Head: Normocephalic and atraumatic.  Mouth/Throat: No oropharyngeal exudate. Mucosa moist. Eyes: Pupils are equal, round, and reactive to light. Conjunctivae are normal. No scleral icterus.  Neck: Normal range of motion. Neck supple. No JVD present.  Cardiovascular: Normal rate, regular rhythm and normal heart sounds.  Exam reveals no gallop and no friction rub.   No murmur heard. Pulmonary/Chest: Effort normal and breath sounds normal. No respiratory distress. No wheezes.No rales.  Abdominal: Soft. Bowel sounds are normal. No distension. There is no tenderness. There  is no guarding.  Musculoskeletal: No edema or tenderness.  Lymphadenopathy: No cervical, axillary or supraclavicular adenopathy.  Neurological: Alert and oriented to person, place, and time. No cranial nerve deficit.  Skin: Skin is warm and dry. No rash noted. No erythema. No pallor.  Psychiatric: Affect and judgment normal.   Labs Appointment on 04/09/2017  Component Date Value Ref Range Status  . WBC 04/09/2017 14.5* 4.0 - 10.5 K/uL Final  . RBC 04/09/2017 5.14  4.22 - 5.81 MIL/uL Final  . Hemoglobin 04/09/2017 15.2  13.0 - 17.0 g/dL Final  . HCT 04/09/2017 44.7  39.0 - 52.0 % Final  . MCV 04/09/2017 87.0  78.0 - 100.0 fL Final  . MCH 04/09/2017 29.6  26.0 - 34.0 pg Final  . MCHC 04/09/2017 34.0  30.0 - 36.0 g/dL Final  . RDW 04/09/2017 14.2  11.5 - 15.5 %  Final  . Platelets 04/09/2017 332  150 - 400 K/uL Final  . Neutrophils Relative % 04/09/2017 64  % Final  . Neutro Abs 04/09/2017 9.4* 1.7 - 7.7 K/uL Final  . Lymphocytes Relative 04/09/2017 27  % Final  . Lymphs Abs 04/09/2017 3.8  0.7 - 4.0 K/uL Final  . Monocytes Relative 04/09/2017 7  % Final  . Monocytes Absolute 04/09/2017 1.0  0.1 - 1.0 K/uL Final  . Eosinophils Relative 04/09/2017 2  % Final  . Eosinophils Absolute 04/09/2017 0.3  0.0 - 0.7 K/uL Final  . Basophils Relative 04/09/2017 0  % Final  . Basophils Absolute 04/09/2017 0.1  0.0 - 0.1 K/uL Final   Performed at [facility], [address], [place], [place] [address]  . Sodium 04/09/2017 134* 135 - 145 mmol/L Final  . Potassium 04/09/2017 4.5  3.5 - 5.1 mmol/L Final  . Chloride 04/09/2017 97* 101 - 111 mmol/L Final  . CO2 04/09/2017 26  22 - 32 mmol/L Final  . Glucose, Bld 04/09/2017 149* 65 - 99 mg/dL Final  . BUN 04/09/2017 18  6 - 20 mg/dL Final  . Creatinine, Ser 04/09/2017 0.89  0.61 - 1.24 mg/dL Final  . Calcium 04/09/2017 9.8  8.9 - 10.3 mg/dL Final  . Total Protein 04/09/2017 7.8  6.5 - 8.1 g/dL Final  . Albumin 04/09/2017 4.4  3.5 - 5.0 g/dL Final  .  AST 04/09/2017 19  15 - 41 U/L Final  . ALT 04/09/2017 17  17 - 63 U/L Final  . Alkaline Phosphatase 04/09/2017 61  38 - 126 U/L Final  . Total Bilirubin 04/09/2017 1.2  0.3 - 1.2 mg/dL Final  . GFR calc non Af Amer 04/09/2017 >60  >60 mL/min Final  . GFR calc Af Amer 04/09/2017 >60  >60 mL/min Final   Comment: (NOTE) The eGFR has been calculated using the CKD EPI equation. This calculation has not been validated in all clinical situations. eGFR's persistently <60 mL/min signify possible Chronic Kidney Disease.   Georgiann Hahn gap 04/09/2017 11  5 - 15 Final   Performed at [facility], [address] [place], [place] [address]  . LDH 04/09/2017 117  98 - 192 U/L Final   Performed at [facility], [address], [place], [place] [address]  . Sed Rate 04/09/2017 2  0 - 16 mm/hr Final   Performed at [facility], [address] [place], [place] [address]  . CRP 04/09/2017 <0.8  <1.0 mg/dL Final   Performed at [facility] [address], [place], [place] [address]  . Ferritin 04/09/2017 236  24 - 336 ng/mL Final   Performed at [facility], [place] [address] [place], [place] [address]  Office Visit on 04/09/2017  Component Date Value Ref Range Status  . b2a2 transcript 04/09/2017 Comment  % Final   Comment: (NOTE)           <0.0032 % (sensitivity limit of assay)   . b3a2 transcript 04/09/2017 Comment  % Final   Comment: (NOTE)           <0.0032 % (sensitivity limit of assay)   . E1A2 Transcript 04/09/2017 Comment  % Final   Comment: (NOTE)           <0.0032 % (sensitivity limit of assay)   . Interpretation (BCRAL): 04/09/2017 Comment   Final   Comment: (NOTE) NEGATIVE for the BCR-ABL1 e1a2 (p190), e13a2 (b2a2, p210) and e14a2 (b3a2, p210) fusion transcripts. These  results do not rule out the presence of rare BCR-ABL1 transcripts not detected by this assay.   . Director Review [facility]): 04/09/2017 Comment   Final   Comment: (NOTE) Constance Goltz, PhD, [facility]                Director, [place] [facility] [place] [facility] [place], [place]   . Background: 04/09/2017 Comment   Corrected   Comment: (NOTE) This assay can detect three different types of BCR-ABL1 fusion transcripts associated with CML, ALL, and AML: e13a2 (previously b2a2) and e14a2 (previously b3a2) (major breakpoint, p210), as well as e1a2 (minor breakpoint, p190). The e13a2 and e14a2 transcript values are titrated to the current International Scale (IS). The standardized baseline is 100% BCR-ABL1 (IS) and major molecular response (MMR) is equivalent to 0.1% BCR-ABL1 (IS) corresponding to a 3-log reduction. Results should be correlated with appropriate clinical and laboratory information as indicated.   . Methodology 04/09/2017 Comment   Corrected   Comment: (NOTE) Total RNA is isolated from the sample and subject to a real-time, reverse transcriptase polymerase chain reaction (RT-PCR). The PCR primers and probes are specific for BCR-ABL1 e13a2, e14a2 and e1a2 fusion transcripts. The ABL1 transcript is amplified as the control for cDNA quantity and quality. Serial dilutions of a validated positive control RNA with known t(9;22) BCR-ABL1 are used as reference for quantification of BCR-ABL1 relative to ABL1. The numeric BCR-ABL1 level is reportd as % BCR-ABL1/ABL1 and the detection sensitivity is 4.5 log below the standard baseline. This test was developed and its performance characteristics determined by [facility]. It has not been cleared or approved by the Food and Drug Administration. References:    1. Anastasia Fiedler and Branford S: Seminars in Hematology 2003;       40 (suppl2):62-68.    2. White HE, et al. Blood 2010; 116: e111-117.    3. NCCN Clinical Practice Guidelines in Oncology, Chronic       Myeloid Leukemia. V2. 2017.                           Performed At: [facility] [address] [place], [place] [id number] Nechama Guard MD [id number] Performed At: [facility] [address] [place], [place] [id number] Nechama Guard MD [id number] Performed at [facility], [address] [place], [place] [address]     CT of abdomen and pelvis done 01/23/2017 that was negative for adenopathy.   Orders Placed This Encounter  Procedures  . CBC with Differential/Platelet    Standing Status:   Future    Number of Occurrences:   1    Standing Expiration Date:   04/09/2018  . Comprehensive metabolic panel    Standing Status:   Future    Number of Occurrences:   1    Standing Expiration Date:   04/09/2018  . Lactate dehydrogenase    Standing Status:   Future    Number of Occurrences:   1    Standing Expiration Date:   04/09/2018  . Sedimentation rate    Standing Status:   Future  Number of Occurrences:   1    Standing Expiration Date:   04/09/2018  . C-reactive protein    Standing Status:   Future    Number of Occurrences:   1    Standing Expiration Date:   05/07/2017  . BCR-ABL1, CML/ALL, PCR, QUANT  . Ferritin    Standing Status:   Future    Number of Occurrences:   1    Standing Expiration Date:   04/09/2018  . Ambulatory Referral for Lung Cancer Screening    Referral Priority:   Routine    Referral Type:   Consultation    Referral Reason:   Specialty Services Required    Number of Visits Requested:   [place] MD

## 2017-04-24 ENCOUNTER — Encounter (HOSPITAL_COMMUNITY): Payer: Self-pay | Admitting: Adult Health

## 2017-04-24 ENCOUNTER — Other Ambulatory Visit (HOSPITAL_COMMUNITY)
Admission: RE | Admit: 2017-04-24 | Discharge: 2017-04-24 | Disposition: A | Discharge status: Home / Self Care | Payer: Medicare HMO | Source: Ambulatory Visit | Attending: Cardiology | Admitting: Cardiology

## 2017-04-24 ENCOUNTER — Inpatient Hospital Stay (HOSPITAL_COMMUNITY): Payer: Medicare HMO | Admitting: Adult Health

## 2017-04-24 VITALS — BP 127/66 | HR 62 | Temp 97.7°F | Resp 16 | Wt 186.4 lb

## 2017-04-24 DIAGNOSIS — I4891 Unspecified atrial fibrillation: Secondary | ICD-10-CM | POA: Insufficient documentation

## 2017-04-24 DIAGNOSIS — Z7901 Long term (current) use of anticoagulants: Secondary | ICD-10-CM | POA: Diagnosis not present

## 2017-04-24 DIAGNOSIS — I10 Essential (primary) hypertension: Secondary | ICD-10-CM | POA: Diagnosis not present

## 2017-04-24 DIAGNOSIS — D473 Essential (hemorrhagic) thrombocythemia: Secondary | ICD-10-CM | POA: Diagnosis not present

## 2017-04-24 DIAGNOSIS — E119 Type 2 diabetes mellitus without complications: Secondary | ICD-10-CM | POA: Diagnosis not present

## 2017-04-24 DIAGNOSIS — F17210 Nicotine dependence, cigarettes, uncomplicated: Secondary | ICD-10-CM

## 2017-04-24 DIAGNOSIS — D72829 Elevated white blood cell count, unspecified: Secondary | ICD-10-CM | POA: Diagnosis not present

## 2017-04-24 LAB — CBC
HCT: 40.3 % (ref 39.0–52.0)
HEMOGLOBIN: 13.7 g/dL (ref 13.0–17.0)
MCH: 30.1 pg (ref 26.0–34.0)
MCHC: 34 g/dL (ref 30.0–36.0)
MCV: 88.6 fL (ref 78.0–100.0)
Platelets: 294 10*3/uL (ref 150–400)
RBC: 4.55 MIL/uL (ref 4.22–5.81)
RDW: 14.1 % (ref 11.5–15.5)
WBC: 12.1 10*3/uL — ABNORMAL HIGH (ref 4.0–10.5)

## 2017-04-24 LAB — BASIC METABOLIC PANEL
Anion gap: 10 (ref 5–15)
BUN: 18 mg/dL (ref 6–20)
CHLORIDE: 99 mmol/L — AB (ref 101–111)
CO2: 24 mmol/L (ref 22–32)
Calcium: 9.4 mg/dL (ref 8.9–10.3)
Creatinine, Ser: 0.86 mg/dL (ref 0.61–1.24)
GFR calc Af Amer: >60 mL/min (ref >60)
GFR calc non Af Amer: >60 mL/min (ref >60)
GLUCOSE: 249 mg/dL — AB (ref 65–99)
POTASSIUM: 4.5 mmol/L (ref 3.5–5.1)
SODIUM: 133 mmol/L — AB (ref 135–145)

## 2017-04-24 NOTE — Patient Instructions (Addendum)
[place] at [facility] Discharge Instructions  RECOMMENDATIONS MADE BY THE CONSULTANT AND ANY TEST RESULTS WILL BE SENT TO YOUR REFERRING PHYSICIAN.  You saw Mike Craze, NP, today Labs only in 2 months RTC for FU in 4 months with labs and to see Dr. Raliegh Ip.  See Amy at checkout for appointments.   Thank you for choosing [place] at [facility] to provide your oncology and hematology care.  To afford each patient quality time with our provider, please arrive at least 15 minutes before your scheduled appointment time.    If you have a lab appointment with the [place] please come in thru the  Main Entrance and check in at the main information desk  You need to re-schedule your appointment should you arrive 10 or more minutes late.  We strive to give you quality time with our providers, and arriving late affects you and other patients whose appointments are after yours.  Also, if you no show three or more times for appointments you may be dismissed from the clinic at the providers discretion.     Again, thank you for choosing [facility].  Our hope is that these requests will decrease the amount of time that you wait before being seen by our physicians.       _____________________________________________________________  Should you have questions after your visit to [facility], please contact our office at [phone] between the hours of 8:30 a.m. and 4:30 p.m.  Voicemails left after 4:30 p.m. will not be returned until the following business day.  For prescription refill requests, have your pharmacy contact our office.       Resources For Cancer Patients and their Caregivers ? American Cancer Society: Can assist with transportation, wigs, general needs, runs Look Good Feel Better.        9205007123 ? Cancer Care: Provides financial assistance, online support groups, medication/co-pay assistance.   1-800-813-HOPE (343) 582-2773) ? [place] Assists [place] cancer patients and their families through emotional , educational and financial support.  775-123-5894 ? [place] DSS Where to apply for food stamps, Medicaid and utility assistance. ([phone] 180-7337 ? RCATS: Transportation to medical appointments. 8430981473 ? Social Security Administration: May apply for disability if have a Stage IV cancer. [phone] 619-306-3421 ? [employer], Disability and Transit Services: Assists with nutrition, care and transit needs. [place] Support Programs: @10RELATIVEDAYS @ > Cancer Support Group  2nd Tuesday of the month 1pm-2pm, Journey Room  > Creative Journey  3rd Tuesday of the month 1130am-1pm, Journey Room  > Look Good Feel Better  1st Wednesday of the month 10am-12 noon, Journey Room (Call [place] to register [phone])

## 2017-04-24 NOTE — Progress Notes (Addendum)
[name], [place] [address]   CLINIC:  Medical Oncology/Hematology  PCP:  [facility], [place] [address] [phone]   REASON FOR VISIT:  Follow-up for Leukocytosis   CURRENT THERAPY: Initial work-up    HISTORY OF PRESENT ILLNESS:  (From Dr. Walden Field' note on 04/09/17)       INTERVAL HISTORY:  [name] 62 y.o. male returns for routine follow-up for leukocytosis.   Here today with his daughter.   Overall, he tells me he has been feeling "okay."  Appetite 75%; energy levels 50%.  He has chronic arthralgias to his legs, back, arms, and feet.  He struggles with intermittent fatigue and weakness.  He has peripheral neuropathy to his legs and feet, which is also chronic and stable.  Currently on Xarelto for A-fib.  Endorses occasional nausea and vomiting, which his daughter attributes to his anxiety.  "He just gets so worked up sometimes that he gets sick to his stomach."  Currently on Zoloft 25 mg daily, with Klonopin as needed for anxiety.  His mental health is managed by his PCP.  At his last visit he had peripheral blood workup done for his leukocytosis.  He is here to review those results together today.    REVIEW OF SYSTEMS:  Review of Systems  Constitutional: Positive for fatigue. Negative for chills and fever.  HENT:  Negative.   Eyes: Negative.   Respiratory: Negative.  Negative for cough and shortness of breath.   Cardiovascular: Negative for chest pain.       A-fib  Gastrointestinal: Positive for nausea and vomiting. Negative for constipation and diarrhea.  Endocrine: Negative.   Genitourinary: Negative.    Musculoskeletal: Positive for arthralgias and back pain.  Skin: Negative.   Neurological: Positive for numbness.  Hematological: Negative.   Psychiatric/Behavioral: Positive for depression. The patient is nervous/anxious.      PAST MEDICAL/SURGICAL HISTORY:  Past Medical History:    Diagnosis Date  . Anxiety   . Atrial fibrillation [facility])    Diagnosed February 2016  . Chronic back pain   . Depression   . Essential hypertension   . GERD (gastroesophageal reflux disease)   . H. pylori infection 07/2011  . Hiatal hernia   . Memory deficits   . Migraines   . Tubulovillous adenoma of colon 05/23/10  . Type 2 diabetes mellitus ([place])    Past Surgical History:  Procedure Laterality Date  . CHOLECYSTECTOMY  2006  . COLONOSCOPY  05/15/10   Rourk-friable anal canal, 1cm pedunculated TVA  mid-sigmoid , diminutive adjacent polyp ablated  . ESOPHAGOGASTRODUODENOSCOPY  07/20/2011   Dr. Gala Romney- normal esophagus-s/p maloney dialation, small hiatal hernia, hpylori +  . UMBILICAL HERNIA REPAIR  2006     SOCIAL HISTORY:  Social History   Socioeconomic History  . Marital status: Divorced    Spouse name: Not on file  . Number of children: 2  . Years of education: Not on file  . Highest education level: Not on file  Social Needs  . Financial resource strain: Not on file  . Food insecurity - worry: Not on file  . Food insecurity - inability: Not on file  . Transportation needs - medical: Not on file  . Transportation needs - non-medical: Not on file  Occupational History  . Occupation: Disabled    Employer: NOT EMPLOYED  Tobacco Use  . Smoking status: Current Every Day Smoker    Packs/day: 2.00  Years: 45.00    Pack years: 90.00    Types: Cigarettes    Start date: 05/19/1968  . Smokeless tobacco: Never Used  Substance and Sexual Activity  . Alcohol use: No    Alcohol/week: 0.0 oz    Frequency: Never    Comment: quit; used to be heavy drinker but quit 15 years  . Drug use: No  . Sexual activity: Not on file  Other Topics Concern  . Not on file  Social History Narrative   Lives w/ mother    FAMILY HISTORY:  Family History  Problem Relation Age of Onset  . Diabetes Mother   . Osteoporosis Father   . Ulcers Father   . Diabetes Brother   . Diabetes  Brother     CURRENT MEDICATIONS:  Outpatient Encounter Medications as of 04/24/2017  Medication Sig  . atorvastatin (LIPITOR) 20 MG tablet Take 20 mg by mouth every morning.  . clonazePAM (KLONOPIN) 0.5 MG tablet Take 0.5 mg by mouth daily as needed for anxiety.  . Elastic Bandages & Supports (WRIST SPLINT/COCK-UP/RIGHT L) MISC 1 Device by Does not apply route daily. g26.01  . glipiZIDE (GLUCOTROL) 10 MG tablet Take 10 mg by mouth every morning.  Marland Kitchen lisinopril (PRINIVIL,ZESTRIL) 5 MG tablet Take 5 mg by mouth every morning.  . loperamide (IMODIUM A-D) 2 MG tablet Take 1 tablet (2 mg total) by mouth 4 (four) times daily as needed for diarrhea or loose stools.  . metFORMIN (GLUCOPHAGE) 500 MG tablet TAKE 2 TABLETS BY MOUTH TWICE DAILY FOR 90 DAYS  . Omega-3 Fatty Acids (OMEGA 3 500) 500 MG CAPS Take by mouth daily.  . promethazine (PHENERGAN) 25 MG tablet Take 1 tablet (25 mg total) by mouth every 6 (six) hours as needed.  . rivaroxaban (XARELTO) 20 MG TABS tablet Take 1 tablet (20 mg total) by mouth daily with supper.  . sertraline (ZOLOFT) 25 MG tablet Take 25 mg by mouth daily.   No facility-administered encounter medications on file as of 04/24/2017.     ALLERGIES:  No Known Allergies   PHYSICAL EXAM:  ECOG Performance status: 1 - Symptomatic; remains largely independent   Vitals:   04/24/17 0953  BP: 127/66  Pulse: 62  Resp: 16  Temp: 97.7 F (36.5 C)  SpO2: 99%   Filed Weights   04/24/17 0953  Weight: 186 lb 6.4 oz (84.6 kg)    Physical Exam  Constitutional: He is oriented to person, place, and time and well-developed, well-nourished, and in no distress.  HENT:  Head: Normocephalic.  Mouth/Throat: Oropharynx is clear and moist. No oropharyngeal exudate.  Eyes: Conjunctivae are normal. Pupils are equal, round, and reactive to light. No scleral icterus.  Neck: Normal range of motion. Neck supple.  Cardiovascular: Normal rate and regular rhythm.  Pulmonary/Chest:  Effort normal and breath sounds normal. No respiratory distress.  Abdominal: Soft. Bowel sounds are normal. There is no tenderness.  Musculoskeletal: Normal range of motion. He exhibits no edema.  Lymphadenopathy:    He has no cervical adenopathy.    He has no axillary adenopathy.       Right: No supraclavicular adenopathy present.       Left: No supraclavicular adenopathy present.  Neurological: He is alert and oriented to person, place, and time. No cranial nerve deficit. Gait normal.  Skin: Skin is warm and dry. No rash noted.  Psychiatric: Memory and judgment normal.  Anxious affect and mood   Nursing note and vitals reviewed.  LABORATORY DATA:  I have reviewed the labs as listed.  CBC    Component Value Date/Time   WBC 12.1 (H) 04/24/2017 1051   RBC 4.55 04/24/2017 1051   HGB 13.7 04/24/2017 1051   HCT 40.3 04/24/2017 1051   PLT 294 04/24/2017 1051   MCV 88.6 04/24/2017 1051   MCH 30.1 04/24/2017 1051   MCHC 34.0 04/24/2017 1051   RDW 14.1 04/24/2017 1051   LYMPHSABS 3.8 04/09/2017 0947   MONOABS 1.0 04/09/2017 0947   EOSABS 0.3 04/09/2017 0947   BASOSABS 0.1 04/09/2017 0947   CMP Latest Ref Rng & Units 04/24/2017 04/09/2017 01/23/2017  Glucose 65 - 99 mg/dL 249(H) 149(H) 180(H)  BUN 6 - 20 mg/dL 18 18 34(H)  Creatinine 0.61 - 1.24 mg/dL 0.86 0.89 1.12  Sodium 135 - 145 mmol/L 133(L) 134(L) 131(L)  Potassium 3.5 - 5.1 mmol/L 4.5 4.5 3.8  Chloride 101 - 111 mmol/L 99(L) 97(L) 96(L)  CO2 22 - 32 mmol/L 24 26 21(L)  Calcium 8.9 - 10.3 mg/dL 9.4 9.8 9.6  Total Protein 6.5 - 8.1 g/dL - 7.8 8.2(H)  Total Bilirubin 0.3 - 1.2 mg/dL - 1.2 2.1(H)  Alkaline Phos 38 - 126 U/L - 61 89  AST 15 - 41 U/L - 19 23  ALT 17 - 63 U/L - 17 28   Results for YORDY, MATTON (MRN [id number])   Ref. Range 04/09/2017 09:47  LDH Latest Ref Range: 98 - 192 U/L 117  Ferritin Latest Ref Range: 24 - 336 ng/mL 236  CRP Latest Ref Range: <1.0 mg/dL <0.8  Results for BRNADON, EOFF (MRN  [id number])  Ref. Range 04/09/2017 09:47  Sed Rate Latest Ref Range: 0 - 16 mm/hr 2    PENDING LABS:    DIAGNOSTIC IMAGING:   PATHOLOGY:        ASSESSMENT & PLAN:   Leukocytosis/Neutrophilia:  -Reviewed recent blood work from with him in great detail from 04/09/17.  WBCs at that time 14.5; [place] 9400. CRP, sed rate, and ferritin normal. LDH normal.   BCR-ABL negative, which rules out CML as cause of leukocytosis. No recent reported fevers, night sweats, or unintentional weight loss.  No adenopathy appreciated on physical exam.   -Shared with him that I suspect this his mildly elevated WBCs are either a normal variant and/or are secondary to his chronic tobacco use.  We spent some time today discussing smoking cessation (see below).   -If his WBCs do not trend down or normalize, then could consider further work-up for myeloproliferative disorder/primary myelofibrosis.  -Labs only in 2 months.  -Return to cancer center in 4 months for follow-up with labs. If WBCs stable at that time, then could consider releasing him from additional follow-up at cancer center at that time if deemed clinically appropriate.   Tobacco use disorder:  -We discussed the pathophysiology of nicotine dependence and different strategies to stop smoking.  The gold standard of tobacco cessation is nicotine replacement (with nicotine patches & gum/lozenges) or Varenicline (Chantix).  He has tried the patches and gum in the past; they were reportedly not helpful for him (although, I suspect he was not using adequate dosage of nicotine replacement).  Encouraged him to consider Chantix; we discussed the pros/cons of this therapy.  If he decides to try this medication, the starter pack with dose-escalation can be prescribed by myself or his PCP.    Mr.Heider  understands that data suggests that "cold [place]" is the least effective way to stop using  tobacco products.  Having a clear "quit plan" is much more effective and requires  a step-wise approach with continued support from a tobacco treatment specialist.  I encouraged him to reach out to me if he has further questions or interest in his continued cessation efforts.  Greater than 15 minutes was spent in smoking cessation counseling with this patient.   -Would recommend annual low-dose CT chest for lung cancer screening.  He has 50+ pack year smoking history.  If he continues to have follow-up here at cancer center more long-term, then we can order this. Otherwise, he can have annual low-dose CT with his PCP.  I discussed this briefly with the patient today; he has quite a bit of anxiety, so will defer to MD at his next follow-up visit here at the cancer center if he feels it is appropriate to order screening CT at that time.   Anxiety about health/Depression:  -Currently being treated for his anxiety/depression with Zoloft 25 mg daily and Klonopin PRN.  Currently being managed by his PCP. I think he would benefit from dose-increase of his Zoloft to 50 mg, with liberal titration up of dose with no clinical improvement in his symptoms within 6-8 weeks of dose-escalation.  Will defer to his PCP for management.  Provided support today with active listening and validation of his concerns.   Chronic anticoagulation:  -On Xarelto for A-fib; managed by cardiology. Continue follow-up with cardiology as recommended.       Dispo:  -Labs only in 2 months. (CBC with diff)  -Return to cancer center in 4 months for follow-up with labs. (CBC with diff, BMP)    All questions were answered to patient's stated satisfaction. Encouraged patient to call with any new concerns or questions before his next visit to the cancer center and we can certain see him sooner, if needed.    A total of 35 minutes was spent in face-to-face care of this patient, with greater than 50% of that time spent in counseling and care-coordination.      Orders placed this encounter:  Orders Placed This  Encounter  Procedures  . CBC with Differential/Platelet  . Basic metabolic panel      Mike Craze, NP [place] [phone]

## 2017-04-30 ENCOUNTER — Ambulatory Visit: Payer: Medicare HMO | Admitting: Internal Medicine

## 2017-04-30 ENCOUNTER — Encounter: Payer: Self-pay | Admitting: Internal Medicine

## 2017-05-01 ENCOUNTER — Emergency Department (HOSPITAL_COMMUNITY)
Admission: EM | Admit: 2017-05-01 | Discharge: 2017-05-02 | Disposition: A | Discharge status: Home / Self Care | Payer: Medicare HMO | Attending: Emergency Medicine | Admitting: Emergency Medicine

## 2017-05-01 ENCOUNTER — Emergency Department (HOSPITAL_COMMUNITY): Payer: Medicare HMO

## 2017-05-01 ENCOUNTER — Other Ambulatory Visit: Payer: Self-pay

## 2017-05-01 ENCOUNTER — Encounter (HOSPITAL_COMMUNITY): Payer: Self-pay | Admitting: Emergency Medicine

## 2017-05-01 DIAGNOSIS — F17210 Nicotine dependence, cigarettes, uncomplicated: Secondary | ICD-10-CM | POA: Diagnosis not present

## 2017-05-01 DIAGNOSIS — E119 Type 2 diabetes mellitus without complications: Secondary | ICD-10-CM | POA: Insufficient documentation

## 2017-05-01 DIAGNOSIS — I10 Essential (primary) hypertension: Secondary | ICD-10-CM | POA: Diagnosis not present

## 2017-05-01 DIAGNOSIS — Z79899 Other long term (current) drug therapy: Secondary | ICD-10-CM | POA: Diagnosis not present

## 2017-05-01 DIAGNOSIS — R0789 Other chest pain: Secondary | ICD-10-CM

## 2017-05-01 DIAGNOSIS — R200 Anesthesia of skin: Secondary | ICD-10-CM | POA: Diagnosis not present

## 2017-05-01 DIAGNOSIS — R0602 Shortness of breath: Secondary | ICD-10-CM | POA: Diagnosis not present

## 2017-05-01 DIAGNOSIS — R079 Chest pain, unspecified: Secondary | ICD-10-CM | POA: Diagnosis not present

## 2017-05-01 DIAGNOSIS — R002 Palpitations: Secondary | ICD-10-CM | POA: Diagnosis not present

## 2017-05-01 DIAGNOSIS — M48062 Spinal stenosis, lumbar region with neurogenic claudication: Secondary | ICD-10-CM | POA: Diagnosis not present

## 2017-05-01 LAB — CBC
HCT: 45.8 % (ref 39.0–52.0)
HEMOGLOBIN: 15.4 g/dL (ref 13.0–17.0)
MCH: 29.7 pg (ref 26.0–34.0)
MCHC: 33.6 g/dL (ref 30.0–36.0)
MCV: 88.2 fL (ref 78.0–100.0)
Platelets: 362 10*3/uL (ref 150–400)
RBC: 5.19 MIL/uL (ref 4.22–5.81)
RDW: 13.7 % (ref 11.5–15.5)
WBC: 17 10*3/uL — ABNORMAL HIGH (ref 4.0–10.5)

## 2017-05-01 LAB — BASIC METABOLIC PANEL
ANION GAP: 14 (ref 5–15)
BUN: 27 mg/dL — ABNORMAL HIGH (ref 6–20)
CALCIUM: 10.2 mg/dL (ref 8.9–10.3)
CO2: 23 mmol/L (ref 22–32)
Chloride: 98 mmol/L — ABNORMAL LOW (ref 101–111)
Creatinine, Ser: 0.83 mg/dL (ref 0.61–1.24)
GFR calc Af Amer: >60 mL/min (ref >60)
Glucose, Bld: 226 mg/dL — ABNORMAL HIGH (ref 65–99)
Potassium: 4.4 mmol/L (ref 3.5–5.1)
Sodium: 135 mmol/L (ref 135–145)

## 2017-05-01 LAB — TROPONIN I

## 2017-05-01 LAB — D-DIMER, QUANTITATIVE (NOT AT ARMC)

## 2017-05-01 NOTE — ED Provider Notes (Signed)
Emergency Department Provider Note   I have reviewed the triage vital signs and the nursing notes.   HISTORY  Chief Complaint Chest Pain   HPI Garrett Clements is a 62 y.o. male with PMH of a-fib, HTN, DM presents to the ED for evaluation of chest pain, rapid heart rate, throat tightness, and soreness after lumbar spine injections today. Chest pain started at approximately 7 PM today. No obvious inciting event. No modifying factors. No fever or chills. He reports some lower back soreness but no acute worsening pain. Has some baseline numbness in the LEs from neuropathy which is unchanged. He is typically on Xarelto with a-fib but that was discontinued on 3/1 in preparation for spine injections and the patient is due to start back tomorrow AM. Chest pain is pressure-like and left sided. No pleuritic pain. Patient states that since arrival in the ED his symptoms have greatly improved.    Past Medical History:  Diagnosis Date  . Anxiety   . Atrial fibrillation [facility])    Diagnosed February 2016  . Chronic back pain   . Depression   . Essential hypertension   . GERD (gastroesophageal reflux disease)   . H. pylori infection 07/2011  . Hiatal hernia   . Memory deficits   . Migraines   . Tubulovillous adenoma of colon 05/23/10  . Type 2 diabetes mellitus [facility])     Patient Active Problem List   Diagnosis Date Noted  . Essential hypertension 04/19/2014  . Persistent atrial fibrillation ([place]) 04/19/2014  . Type 2 diabetes mellitus ([place]) 04/19/2014  . GERD (gastroesophageal reflux disease) 06/29/2011    Past Surgical History:  Procedure Laterality Date  . CHOLECYSTECTOMY  2006  . COLONOSCOPY  05/15/10   Rourk-friable anal canal, 1cm pedunculated TVA  mid-sigmoid , diminutive adjacent polyp ablated  . ESOPHAGOGASTRODUODENOSCOPY  07/20/2011   Dr. Gala Romney- normal esophagus-s/p maloney dialation, small hiatal hernia, hpylori +  . UMBILICAL HERNIA REPAIR  2006    Current Outpatient Rx    . Order #: [id number] Class: Historical Med  . Order #: [id number] Class: Historical Med  . Order #: [id number] Class: Historical Med  . Order #: [id number] Class: Historical Med  . Order #: [id number] Class: Print  . Order #: [id number] Class: Historical Med  . Order #: [id number] Class: Normal  . Order #: [id number] Class: Historical Med  . Order #: [id number] Class: Print  . Order #: [id number] Class: Historical Med  . Order #: [id number] Class: Print    Allergies Patient has no known allergies.  Family History  Problem Relation Age of Onset  . Diabetes Mother   . Osteoporosis Father   . Ulcers Father   . Diabetes Brother   . Diabetes Brother     Social History Social History   Tobacco Use  . Smoking status: Current Every Day Smoker    Packs/day: 2.00    Years: 45.00    Pack years: 90.00    Types: Cigarettes    Start date: 05/19/1968  . Smokeless tobacco: Never Used  Substance Use Topics  . Alcohol use: No    Alcohol/week: 0.0 oz    Frequency: Never    Comment: quit; used to be heavy drinker but quit 15 years  . Drug use: No    Review of Systems  Constitutional: No fever/chills Eyes: No visual changes. ENT: No sore throat. Cardiovascular: Positive chest pain and palpitations.  Respiratory: Positive shortness of breath. Positive mild cough.  Gastrointestinal: No abdominal pain. No nausea, no vomiting. No diarrhea. No constipation. Genitourinary: Negative  for dysuria. Musculoskeletal: Positive for chronic back pain. Skin: Negative for rash. Neurological: Negative for headaches, focal weakness. Positive bilateral LE numbness (baseline).   10-point ROS otherwise negative.  ____________________________________________   PHYSICAL EXAM:  VITAL SIGNS: ED Triage Vitals  Enc Vitals Group     BP 05/01/17 1948 (!) 149/100     Pulse Rate 05/01/17 1948 86     Resp 05/01/17 1948 18     Temp 05/01/17 1948 98.2 F (36.8 C)     Temp Source 05/01/17 1948 Temporal     SpO2 05/01/17 1948  99 %     Weight 05/01/17 1948 188 lb (85.3 kg)     Height 05/01/17 1948 5\' 10"  (1.778 m)     Pain Score 05/01/17 1947 6   Constitutional: Alert and oriented. Well appearing and in no acute distress. Eyes: Conjunctivae are normal. Head: Atraumatic. Nose: No congestion/rhinnorhea. Mouth/Throat: Mucous membranes are moist.  Oropharynx non-erythematous. Neck: No stridor.   Cardiovascular: Normal rate, regular rhythm. Good peripheral circulation. Grossly normal heart sounds.   Respiratory: Normal respiratory effort.  No retractions. Lungs CTAB. Gastrointestinal: Soft and nontender. No distention.  Musculoskeletal: No lower extremity tenderness nor edema. No gross deformities of extremities. Neurologic:  Normal speech and language. No gross focal neurologic deficits are appreciated.  Skin:  Skin is warm, dry and intact. No rash noted. No bruising or swelling overlying the lumbar spine.    ____________________________________________   LABS (all labs ordered are listed, but only abnormal results are displayed)  Labs Reviewed  BASIC METABOLIC PANEL - Abnormal; Notable for the following components:      Result Value   Chloride 98 (*)    Glucose, Bld 226 (*)    BUN 27 (*)    All other components within normal limits  CBC - Abnormal; Notable for the following components:   WBC 17.0 (*)    All other components within normal limits  TROPONIN I  D-DIMER, QUANTITATIVE (NOT AT [facility])  TROPONIN I   ____________________________________________  EKG   EKG Interpretation  Date/Time:  Thursday May 02 2017 00:41:12 EST Ventricular Rate:  89 PR Interval:    QRS Duration: 77 QT Interval:  368 QTC Calculation: 448 R Axis:   67 Text Interpretation:  Atrial fibrillation No significant change was found Confirmed by Ezequiel Essex [phone]) on 05/02/2017 12:44:03 AM       ____________________________________________  RADIOLOGY  Dg Chest 2 View  Result Date: 05/01/2017 CLINICAL DATA:   Chest pain EXAM: CHEST - 2 VIEW COMPARISON:  01/23/2017, 07/18/2004 FINDINGS: Multiple small pulmonary nodules are again noted. No acute consolidation or effusion. Normal cardiomediastinal silhouette. A faint nodular opacity in the left lower lung could reflect a nipple shadow. IMPRESSION: 1. No radiographic evidence for acute cardiopulmonary abnormality 2. Stable multiple bilateral lung nodules as compared with 2006 radiograph consistent with benign finding, likely granuloma. 3. Nodular opacity at the left lung base may reflect nipple shadow. Short interval radiographic follow-up with nipple markers suggested. Electronically Signed   By: Donavan Foil M.D.   On: 05/01/2017 20:07    ____________________________________________   PROCEDURES  Procedure(s) performed:   Procedures  None ____________________________________________   INITIAL IMPRESSION / ASSESSMENT AND PLAN / ED COURSE  Pertinent labs & imaging results that were available during my care of the patient were reviewed by me and considered in my medical decision making (see chart for details).  Patient presents to the ED for evaluation of CP and SOB s/p lumbar spine steroid  injection today. No severe back pain or neuro deficits on exam. Low suspicion for ACS or PE but patient's anticoagulation was held for spine injections. Initial CXR, troponin, and other labs are normal. Sending d-dimer and plan for troponin trending.  Differential includes all life-threatening causes for chest pain. This includes but is not exclusive to acute coronary syndrome, aortic dissection, pulmonary embolism, cardiac tamponade, community-acquired pneumonia, pericarditis, musculoskeletal chest wall pain, etc.  Patient labs and CXR reviewed. No acute abnormalities. D-dimer negative. Troponin negative x 2. Patient is chest pain free. Patient to f/u with PCP this week.   At this time, I do not feel there is any life-threatening condition present. I have  reviewed and discussed all results (EKG, imaging, lab, urine as appropriate), exam findings with patient. I have reviewed nursing notes and appropriate previous records.  I feel the patient is safe to be discharged home without further emergent workup. Discussed usual and customary return precautions. Patient and family (if present) verbalize understanding and are comfortable with this plan.  Patient will follow-up with their primary care provider. If they do not have a primary care provider, information for follow-up has been provided to them. All questions have been answered.   ____________________________________________  FINAL CLINICAL IMPRESSION(S) / ED DIAGNOSES  Final diagnoses:  Atypical chest pain    Note:  This document was prepared using Dragon voice recognition software and may include unintentional dictation errors.  Nanda Quinton, MD Emergency Medicine    Long, [facility], MD 05/02/17 [phone]

## 2017-05-01 NOTE — ED Triage Notes (Signed)
Pt had epidural today in lower back and this evening is having chest pain, sob and states his neck is feeling funny. Pt is speaking complete sentences in triage.

## 2017-05-02 LAB — TROPONIN I: Troponin I: <0.03 ng/mL (ref <0.03)

## 2017-05-02 NOTE — ED Provider Notes (Signed)
Care assumed from Dr. Laverta Baltimore.  Patient pending second troponin for chest pain workup.  Reports episode of left-sided chest pain with shortness of breath that was constant from 6 PM to 10 PM.  EKG is nonischemic.  He has A. fib and is due to start his anticoagulant back in the morning.  Second troponin negative.  No more chest pain. Has appointment with Dr. Domenic Polite in 1 day.  Discussed keeping this follow-up appointment, return to the ED if chest pain becomes exertional, associated with shortness of breath, nausea, diaphoresis or any other concerns.  BP 119/69   Pulse 81   Temp 97.6 F (36.4 C) (Oral)   Resp (!) 22   Ht 5\' 10"  (1.778 m)   Wt 85.3 kg (188 lb)   SpO2 97%   BMI 26.98 kg/m     Ezequiel Essex, MD 05/02/17 [phone]

## 2017-05-02 NOTE — Progress Notes (Signed)
Cardiology Office Note  Date: 05/03/2017   ID: Lavan, Imes 04/29/1955, MRN [id number]  PCP: Redmond School, MD  Primary Cardiologist: Rozann Lesches, MD   Chief Complaint  Patient presents with  . Atrial Fibrillation    History of Present Illness: NAMARI BRETON is a 62 y.o. male last seen in December 2018 after a long hiatus.  I reviewed his records and see that he was just seen in the ER within the last 24-48 hours with chest discomfort and palpitations following lumbar spine pain injections.  He had been off Xarelto for that procedure.  Troponin I levels were negative.  ECG showed atrial fibrillation with nonspecific T wave changes.  He was discharged home to follow-up with me.  He is here today with his daughter.  He tells me that in a period of hours after having his spine injection he became suddenly short of breath with a feeling of swelling in his throat and also reported rash on his arms.  He also felt discomfort in his upper chest and his heart rate was elevated.  He has had no recurring symptoms since recent assessment in the ER.  I reviewed his medications which are outlined below.  He is starting back on Xarelto after his injection.  He is not on any heart rate control agents.  Today we discussed adding Toprol-XL to provide better general heart rate control of atrial fibrillation.  Certainly, if he continues to manifest the recent symptoms we can always consider further ischemic workup.  It sounds like however that this was related temporally to the spine injection.  Past Medical History:  Diagnosis Date  . Anxiety   . Atrial fibrillation [facility])    Diagnosed February 2016  . Chronic back pain   . Depression   . Essential hypertension   . GERD (gastroesophageal reflux disease)   . H. pylori infection 07/2011  . Hiatal hernia   . Memory deficits   . Migraines   . Tubulovillous adenoma of colon 05/23/10  . Type 2 diabetes mellitus ([place])     Past Surgical  History:  Procedure Laterality Date  . CHOLECYSTECTOMY  2006  . COLONOSCOPY  05/15/10   Rourk-friable anal canal, 1cm pedunculated TVA  mid-sigmoid , diminutive adjacent polyp ablated  . ESOPHAGOGASTRODUODENOSCOPY  07/20/2011   Dr. Gala Romney- normal esophagus-s/p maloney dialation, small hiatal hernia, hpylori +  . UMBILICAL HERNIA REPAIR  2006    Current Outpatient Medications  Medication Sig Dispense Refill  . atorvastatin (LIPITOR) 20 MG tablet Take 20 mg by mouth every morning.    . clonazePAM (KLONOPIN) 0.5 MG tablet Take 0.5 mg by mouth daily as needed for anxiety.    . Elastic Bandages & Supports (WRIST SPLINT/COCK-UP/RIGHT L) MISC 1 Device by Does not apply route daily. g26.01 1 each 0  . glipiZIDE (GLUCOTROL) 10 MG tablet Take 10 mg by mouth every morning.  2  . lisinopril (PRINIVIL,ZESTRIL) 5 MG tablet Take 5 mg by mouth every morning.    . loperamide (IMODIUM A-D) 2 MG tablet Take 1 tablet (2 mg total) by mouth 4 (four) times daily as needed for diarrhea or loose stools. 30 tablet 0  . metFORMIN (GLUCOPHAGE) 500 MG tablet TAKE 2 TABLETS BY MOUTH TWICE DAILY FOR 90 DAYS  0  . promethazine (PHENERGAN) 25 MG tablet Take 1 tablet (25 mg total) by mouth every 6 (six) hours as needed. (Patient taking differently: Take 25 mg by mouth every 6 (six) hours as  needed for nausea or vomiting. ) 12 tablet 1  . rivaroxaban (XARELTO) 20 MG TABS tablet Take 1 tablet (20 mg total) by mouth daily with supper. 30 tablet 6  . sertraline (ZOLOFT) 25 MG tablet Take 25 mg by mouth daily.    . metoprolol succinate (TOPROL XL) 25 MG 24 hr tablet Take 1 tablet (25 mg total) by mouth daily. 90 tablet 3   No current facility-administered medications for this visit.    Allergies:  Patient has no known allergies.   Social History: The patient  reports that he has been smoking cigarettes.  He started smoking about 48 years ago. He has a 90.00 pack-year smoking history. he has never used smokeless tobacco. He  reports that he does not drink alcohol or use drugs.   ROS:  Please see the history of present illness. Otherwise, complete review of systems is positive for chronic back and leg pain.  All other systems are reviewed and negative.   Physical Exam: VS:  BP 128/82   Pulse 92   Ht 5' 10"  (1.778 m)   Wt 190 lb (86.2 kg)   SpO2 97%   BMI 27.26 kg/m , BMI Body mass index is 27.26 kg/m.  Wt Readings from Last 3 Encounters:  05/03/17 190 lb (86.2 kg)  05/01/17 188 lb (85.3 kg)  04/24/17 186 lb 6.4 oz (84.6 kg)    General: Chronically ill-appearing male, no distress. HEENT: Conjunctiva and lids normal, oropharynx clear. Neck: Supple, no elevated JVP or carotid bruits, no thyromegaly. Lungs: Clear to auscultation, nonlabored breathing at rest. Cardiac: Irregularly irregular, no S3 or significant systolic murmur, no pericardial rub. Abdomen: Soft, nontender, bowel sounds present. Extremities: No pitting edema, distal pulses 2+. Skin: Warm and dry. Musculoskeletal: No kyphosis. Neuropsychiatric: Alert and oriented x3, affect grossly appropriate.  ECG: I personally reviewed the tracing from 05/01/2017 which showed atrial fibrillation with nonspecific T wave changes.  Recent Labwork: 04/09/2017: ALT 17; AST 19 05/01/2017: BUN 27; Creatinine, Ser 0.83; Hemoglobin 15.4; Platelets 362; Potassium 4.4; Sodium 135   Other Studies Reviewed Today:  Echocardiogram 04/21/2014: Study Conclusions  - Left ventricle: The cavity size was normal. Wall thickness was increased in a pattern of mild LVH. Systolic function was normal. The estimated ejection fraction was in the range of 55% to 60%. Wall motion was normal; there were no regional wall motion abnormalities. The study is not technically sufficient to allow evaluation of LV diastolic function. - Aortic valve: Mildly calcified annulus. Trileaflet. There was no significant regurgitation. - Aortic root: The aortic root was mildly  ectatic. - Mitral valve: Calcified annulus. There was trivial regurgitation. - Left atrium: The atrium was mildly dilated. - Right atrium: Central venous pressure (est): 3 mm Hg. - Atrial septum: No defect or patent foramen ovale was identified. - Tricuspid valve: There was trivial regurgitation. - Pulmonary arteries: Systolic pressure could not be accurately estimated. - Pericardium, extracardiac: There was no pericardial effusion.  Impressions:  - Mild LVH with LVEF 55-60%. Indeterminate diastolic function in the setting of atrial fibrillation. Mild left atrial enlargement. MAC with trivial mitral regurgitation. Mildly ectatic aortic root. Unable to assess PASP.  Assessment and Plan:  1.  Recent episode of shortness of breath, reported rash that was transient, also upper chest discomfort and a feeling of swelling in his throat.  This occurred after a spine injection for pain control.  Assessment in the ER showed a normal troponin I levels, he did have a rapid atrial fibrillation at  the time as well.  For now would recommend adding Toprol-XL 25 mg daily for heart rate control, start back on Xarelto.  If he continues to manifest recurring chest pain or increasing shortness of breath, further ischemic testing can be pursued.  2.  Essential hypertension, continues on lisinopril.  3.  Type 2 diabetes mellitus, he follows with Dr. Gerarda Fraction.  4.  Mixed hyperlipidemia, on Lipitor.  Current medicines were reviewed with the patient today.  Disposition: Follow-up in 1 month.  Signed, Satira Sark, MD, [facility] 05/03/2017 11:51 AM    [facility] [place]. [address], [place], [place] [address] Phone: [phone]; Fax: [phone]

## 2017-05-02 NOTE — Discharge Instructions (Signed)
Your testing is negative for heart attack.  Follow-up with Dr. Domenic Polite as scheduled tomorrow.  You should have a stress test because your risk of coronary artery disease is low but not 0.  Return to the ED if you develop new or worsening symptoms.

## 2017-05-03 ENCOUNTER — Ambulatory Visit: Payer: Medicare HMO | Admitting: Cardiology

## 2017-05-03 ENCOUNTER — Encounter: Payer: Self-pay | Admitting: Cardiology

## 2017-05-03 VITALS — BP 128/82 | HR 92 | Ht 70.0 in | Wt 190.0 lb

## 2017-05-03 DIAGNOSIS — E782 Mixed hyperlipidemia: Secondary | ICD-10-CM

## 2017-05-03 DIAGNOSIS — E1165 Type 2 diabetes mellitus with hyperglycemia: Secondary | ICD-10-CM | POA: Diagnosis not present

## 2017-05-03 DIAGNOSIS — I482 Chronic atrial fibrillation, unspecified: Secondary | ICD-10-CM

## 2017-05-03 DIAGNOSIS — I10 Essential (primary) hypertension: Secondary | ICD-10-CM

## 2017-05-03 MED ORDER — METOPROLOL SUCCINATE ER 25 MG PO TB24: 25.0000 mg | ORAL_TABLET | Freq: Every day | ORAL | 3 refills | Status: DC

## 2017-05-03 NOTE — Patient Instructions (Signed)
Your physician recommends that you schedule a follow-up appointment in: 1 month with Tanzania Strader PA-C    START Toprol XL  25 mg daily   You may resume your Xarelto today    No lab work or tests ordered today.      Thank you for choosing [facility] !

## 2017-05-16 ENCOUNTER — Other Ambulatory Visit: Payer: Self-pay | Admitting: Acute Care

## 2017-05-16 DIAGNOSIS — F17210 Nicotine dependence, cigarettes, uncomplicated: Secondary | ICD-10-CM

## 2017-05-16 DIAGNOSIS — Z122 Encounter for screening for malignant neoplasm of respiratory organs: Secondary | ICD-10-CM

## 2017-05-22 ENCOUNTER — Encounter: Payer: Self-pay | Admitting: Acute Care

## 2017-05-22 ENCOUNTER — Ambulatory Visit (INDEPENDENT_AMBULATORY_CARE_PROVIDER_SITE_OTHER)
Admission: RE | Admit: 2017-05-22 | Discharge: 2017-05-22 | Disposition: A | Discharge status: Home / Self Care | Payer: Medicare HMO | Source: Ambulatory Visit | Attending: Acute Care | Admitting: Acute Care

## 2017-05-22 ENCOUNTER — Ambulatory Visit (INDEPENDENT_AMBULATORY_CARE_PROVIDER_SITE_OTHER): Payer: Medicare HMO | Admitting: Acute Care

## 2017-05-22 DIAGNOSIS — F17210 Nicotine dependence, cigarettes, uncomplicated: Secondary | ICD-10-CM | POA: Diagnosis not present

## 2017-05-22 DIAGNOSIS — Z87891 Personal history of nicotine dependence: Secondary | ICD-10-CM | POA: Diagnosis not present

## 2017-05-22 DIAGNOSIS — Z122 Encounter for screening for malignant neoplasm of respiratory organs: Secondary | ICD-10-CM

## 2017-05-22 NOTE — Progress Notes (Signed)
Shared Decision Making Visit Lung Cancer Screening Program [phone])   Eligibility:  Age 62 y.o.  Pack Years Smoking History Calculation 72 pack year smoking history (# packs/per year x # years smoked)  Recent History of coughing up blood  no  Unexplained weight loss? no ( >Than 15 pounds within the last 6 months )  Prior History Lung / other cancer no (Diagnosis within the last 5 years already requiring surveillance chest CT Scans).  Smoking Status Current Smoker  Former Smokers: Years since quit: NA  Quit Date: NA  Visit Components:  Discussion included one or more decision making aids. yes  Discussion included risk/benefits of screening. yes  Discussion included potential follow up diagnostic testing for abnormal scans. yes  Discussion included meaning and risk of over diagnosis. yes  Discussion included meaning and risk of False Positives. yes  Discussion included meaning of total radiation exposure. yes  Counseling Included:  Importance of adherence to annual lung cancer LDCT screening. yes  Impact of comorbidities on ability to participate in the program. yes  Ability and willingness to under diagnostic treatment. yes  Smoking Cessation Counseling:  Current Smokers:   Discussed importance of smoking cessation. yes  Information about tobacco cessation classes and interventions provided to patient. yes  Patient provided with "ticket" for LDCT Scan. yes  Symptomatic Patient. no  Counseling  Diagnosis Code: Tobacco Use Z72.0  Asymptomatic Patient yes  Counseling (Intermediate counseling: > three minutes counseling) U0454  Former Smokers:   Discussed the importance of maintaining cigarette abstinence. yes  Diagnosis Code: Personal History of Nicotine Dependence. U98.119  Information about tobacco cessation classes and interventions provided to patient. Yes  Patient provided with "ticket" for LDCT Scan. yes  Written Order for Lung Cancer  Screening with LDCT placed in Epic. Yes (CT Chest Lung Cancer Screening Low Dose W/O CM) [id number] Z12.2-Screening of respiratory organs Z87.891-Personal history of nicotine dependence  I have spent 25 minutes of face to face time with Mr. Steinhart and his granddaughter  discussing the risks and benefits of lung cancer screening. We viewed a power point together that explained in detail the above noted topics. We paused at intervals to allow for questions to be asked and answered to ensure understanding.We discussed that the single most powerful action that he can take to decrease his risk of developing lung cancer is to quit smoking. We discussed whether or not he is ready to commit to setting a quit date. He is not ready to set a quit date.We discussed options for tools to aid in quitting smoking including nicotine replacement therapy, non-nicotine medications, support groups, Quit Smart classes, and behavior modification. We discussed that often times setting smaller, more achievable goals, such as eliminating 1 cigarette a day for a week and then 2 cigarettes a day for a week can be helpful in slowly decreasing the number of cigarettes smoked. This allows for a sense of accomplishment as well as providing a clinical benefit. I gave him the " Be Stronger Than Your Excuses" card with contact information for community resources, classes, free nicotine replacement therapy, and access to mobile apps, text messaging, and on-line smoking cessation help. I have also given him my card and contact information in the event he needs to contact me. We discussed the time and location of the scan, and that either Doroteo Glassman RN or I will call with the results within 24-48 hours of receiving them. I have offered him  a copy of the power point we  viewed  as a resource in the event they need reinforcement of the concepts we discussed today in the office. The patient verbalized understanding of all of  the above and had no  further questions upon leaving the office. They have my contact information in the event they have any further questions.  I spent 6 minutes counseling on smoking cessation and the health risks of continued tobacco abuse.  I explained to the patient that there has been a high incidence of coronary artery disease noted on these exams. I explained that this is a non-gated exam therefore degree or severity cannot be determined. This patient is currently on statin therapy. I have asked the patient to follow-up with their PCP regarding any incidental finding of coronary artery disease and management with diet or medication as their PCP  feels is clinically indicated. The patient verbalized understanding of the above and had no further questions upon completion of the visit.      Magdalen Spatz, NP 05/22/2017 11:43 AM

## 2017-05-29 ENCOUNTER — Other Ambulatory Visit: Payer: Self-pay | Admitting: Acute Care

## 2017-05-29 DIAGNOSIS — Z122 Encounter for screening for malignant neoplasm of respiratory organs: Secondary | ICD-10-CM

## 2017-05-29 DIAGNOSIS — F17210 Nicotine dependence, cigarettes, uncomplicated: Principal | ICD-10-CM

## 2017-06-04 ENCOUNTER — Ambulatory Visit: Payer: Medicare HMO | Admitting: Student

## 2017-06-05 DIAGNOSIS — K625 Hemorrhage of anus and rectum: Secondary | ICD-10-CM | POA: Diagnosis not present

## 2017-06-05 DIAGNOSIS — E1140 Type 2 diabetes mellitus with diabetic neuropathy, unspecified: Secondary | ICD-10-CM | POA: Diagnosis not present

## 2017-06-05 DIAGNOSIS — F329 Major depressive disorder, single episode, unspecified: Secondary | ICD-10-CM | POA: Diagnosis not present

## 2017-06-05 DIAGNOSIS — E663 Overweight: Secondary | ICD-10-CM | POA: Diagnosis not present

## 2017-06-05 DIAGNOSIS — G64 Other disorders of peripheral nervous system: Secondary | ICD-10-CM | POA: Diagnosis not present

## 2017-06-05 DIAGNOSIS — Z6827 Body mass index (BMI) 27.0-27.9, adult: Secondary | ICD-10-CM | POA: Diagnosis not present

## 2017-06-05 DIAGNOSIS — Z1389 Encounter for screening for other disorder: Secondary | ICD-10-CM | POA: Diagnosis not present

## 2017-06-05 DIAGNOSIS — G4700 Insomnia, unspecified: Secondary | ICD-10-CM | POA: Diagnosis not present

## 2017-06-05 DIAGNOSIS — J329 Chronic sinusitis, unspecified: Secondary | ICD-10-CM | POA: Diagnosis not present

## 2017-06-05 DIAGNOSIS — M722 Plantar fascial fibromatosis: Secondary | ICD-10-CM | POA: Diagnosis not present

## 2017-06-20 DIAGNOSIS — E785 Hyperlipidemia, unspecified: Secondary | ICD-10-CM | POA: Insufficient documentation

## 2017-06-20 NOTE — Progress Notes (Deleted)
Cardiology Office Note    Date:  06/20/2017   ID:  Garrett, Clements Apr 06, 1955, MRN [id number]  PCP:  Redmond School, MD  Cardiologist: Rozann Lesches, MD  No chief complaint on file.   History of Present Illness:  Garrett Clements is a 62 y.o. male with history of chronic A. fib on Xarelto, hypertension, DM 2, hyperlipidemia  Patient was seen by Dr. Domenic Polite 05/03/2017 after being in the ER with chest pain and palpitations following lumbar spine pain injections.  He was off Xarelto for that procedure.  Troponins were negative and EKG showed atrial fibrillation.  Toprol-XL was added for better rate control of A. fib and resuming Xarelto.  If recurrent chest pain further ischemic work-up can be pursued.  Past Medical History:  Diagnosis Date  . Anxiety   . Atrial fibrillation [name])    Diagnosed February 2016  . Chronic back pain   . Depression   . Essential hypertension   . GERD (gastroesophageal reflux disease)   . H. pylori infection 07/2011  . Hiatal hernia   . Memory deficits   . Migraines   . Tubulovillous adenoma of colon 05/23/10  . Type 2 diabetes mellitus ([place])     Past Surgical History:  Procedure Laterality Date  . CHOLECYSTECTOMY  2006  . COLONOSCOPY  05/15/10   Rourk-friable anal canal, 1cm pedunculated TVA  mid-sigmoid , diminutive adjacent polyp ablated  . ESOPHAGOGASTRODUODENOSCOPY  07/20/2011   Dr. Gala Romney- normal esophagus-s/p maloney dialation, small hiatal hernia, hpylori +  . UMBILICAL HERNIA REPAIR  2006    Current Medications: No outpatient medications have been marked as taking for the 06/24/17 encounter (Appointment) with [name], PA-C.     Allergies:   Patient has no known allergies.   Social History   Socioeconomic History  . Marital status: Divorced    Spouse name: Not on file  . Number of children: 2  . Years of education: Not on file  . Highest education level: Not on file  Occupational History  . Occupation: Disabled   Fish farm manager: NOT EMPLOYED  Social Needs  . Financial resource strain: Not on file  . Food insecurity:    Worry: Not on file    Inability: Not on file  . Transportation needs:    Medical: Not on file    Non-medical: Not on file  Tobacco Use  . Smoking status: Current Every Day Smoker    Packs/day: 2.00    Years: 45.00    Pack years: 90.00    Types: Cigarettes    Start date: 05/19/1968  . Smokeless tobacco: Never Used  Substance and Sexual Activity  . Alcohol use: No    Alcohol/week: 0.0 oz    Frequency: Never    Comment: quit; used to be heavy drinker but quit 15 years  . Drug use: No  . Sexual activity: Not on file  Lifestyle  . Physical activity:    Days per week: Not on file    Minutes per session: Not on file  . Stress: Not on file  Relationships  . Social connections:    Talks on phone: Not on file    Gets together: Not on file    Attends religious service: Not on file    Active member of club or organization: Not on file    Attends meetings of clubs or organizations: Not on file    Relationship status: Not on file  Other Topics Concern  . Not on file  Social History Narrative   Lives w/ mother     Family History:  The patient's ***family history includes Diabetes in his brother, brother, and mother; Osteoporosis in his father; Ulcers in his father.   ROS:   Please see the history of present illness.    ROS All other systems reviewed and are negative.   PHYSICAL EXAM:   VS:  There were no vitals taken for this visit.  Physical Exam  GEN: Well nourished, well developed, in no acute distress  HEENT: normal  Neck: no JVD, carotid bruits, or masses Cardiac:RRR; no murmurs, rubs, or gallops  Respiratory:  clear to auscultation bilaterally, normal work of breathing GI: soft, nontender, nondistended, + BS Ext: without cyanosis, clubbing, or edema, Good distal pulses bilaterally MS: no deformity or atrophy  Skin: warm and dry, no rash Neuro:  Alert and Oriented x  3, Strength and sensation are intact Psych: euthymic mood, full affect  Wt Readings from Last 3 Encounters:  05/03/17 190 lb (86.2 kg)  05/01/17 188 lb (85.3 kg)  04/24/17 186 lb 6.4 oz (84.6 kg)      Studies/Labs Reviewed:   EKG:  EKG is*** ordered today.  The ekg ordered today demonstrates ***  Recent Labs: 04/09/2017: ALT 17 05/01/2017: BUN 27; Creatinine, Ser 0.83; Hemoglobin 15.4; Platelets 362; Potassium 4.4; Sodium 135   Lipid Panel No results found for: CHOL, TRIG, HDL, CHOLHDL, VLDL, LDLCALC, LDLDIRECT  Additional studies/ records that were reviewed today include:   Echocardiogram 04/21/2014: Study Conclusions  - Left ventricle: The cavity size was normal. Wall thickness was   increased in a pattern of mild LVH. Systolic function was normal.   The estimated ejection fraction was in the range of 55% to 60%.   Wall motion was normal; there were no regional wall motion   abnormalities. The study is not technically sufficient to allow   evaluation of LV diastolic function. - Aortic valve: Mildly calcified annulus. Trileaflet. There was no   significant regurgitation. - Aortic root: The aortic root was mildly ectatic. - Mitral valve: Calcified annulus. There was trivial regurgitation. - Left atrium: The atrium was mildly dilated. - Right atrium: Central venous pressure (est): 3 mm Hg. - Atrial septum: No defect or patent foramen ovale was identified. - Tricuspid valve: There was trivial regurgitation. - Pulmonary arteries: Systolic pressure could not be accurately   estimated. - Pericardium, extracardiac: There was no pericardial effusion.  Impressions:  - Mild LVH with LVEF 55-60%. Indeterminate diastolic function in   the setting of atrial fibrillation. Mild left atrial enlargement.   MAC with trivial mitral regurgitation. Mildly ectatic aortic   root. Unable to assess PASP.    ASSESSMENT:    1. Persistent atrial fibrillation ([place])   2. Essential hypertension    3. Mixed hyperlipidemia      PLAN:  In order of problems listed above:  Persistent atrial fibrillation on Xarelto and Toprol added for better rate control  Essential hypertension  Mixed hyperlipidemia     Medication Adjustments/Labs and Tests Ordered: Current medicines are reviewed at length with the patient today.  Concerns regarding medicines are outlined above.  Medication changes, Labs and Tests ordered today are listed in the Patient Instructions below. There are no Patient Instructions on file for this visit.   Sumner Boast, PA-C  06/20/2017 2:07 PM    [facility] [place], [place],   [address] Phone: [phone]; Fax: [phone]

## 2017-06-24 ENCOUNTER — Other Ambulatory Visit (HOSPITAL_COMMUNITY): Payer: Medicare HMO

## 2017-06-24 ENCOUNTER — Ambulatory Visit: Payer: Medicare HMO | Admitting: Physician Assistant

## 2017-07-08 ENCOUNTER — Other Ambulatory Visit: Payer: Self-pay

## 2017-07-08 ENCOUNTER — Encounter: Payer: Self-pay | Admitting: Gastroenterology

## 2017-07-08 ENCOUNTER — Telehealth: Payer: Self-pay

## 2017-07-08 ENCOUNTER — Ambulatory Visit: Payer: Medicare HMO | Admitting: Gastroenterology

## 2017-07-08 VITALS — BP 112/71 | HR 64 | Temp 97.0°F | Ht 68.0 in | Wt 188.8 lb

## 2017-07-08 DIAGNOSIS — K219 Gastro-esophageal reflux disease without esophagitis: Secondary | ICD-10-CM | POA: Diagnosis not present

## 2017-07-08 DIAGNOSIS — Z86010 Personal history of colonic polyps: Secondary | ICD-10-CM | POA: Diagnosis not present

## 2017-07-08 DIAGNOSIS — R197 Diarrhea, unspecified: Secondary | ICD-10-CM | POA: Diagnosis not present

## 2017-07-08 MED ORDER — PEG 3350-KCL-NA BICARB-NACL 420 G PO SOLR: 4000.0000 mL | ORAL | 0 refills | Status: DC

## 2017-07-08 MED ORDER — FAMOTIDINE 40 MG PO TABS: 40.0000 mg | ORAL_TABLET | Freq: Every day | ORAL | 3 refills | Status: DC | PRN

## 2017-07-08 MED ORDER — DICYCLOMINE HCL 10 MG PO CAPS: 10.0000 mg | ORAL_CAPSULE | Freq: Three times a day (TID) | ORAL | 5 refills | Status: DC

## 2017-07-08 NOTE — Assessment & Plan Note (Signed)
Possibly bile acid diarrhea +/- related to metformin, diabetic neuropathy, etc. Will try bentyl (but will hold week before TCS). We briefly discuss questran or colestid but daughter in law felt it would be difficult to time the medication given he takes meds three times daily.

## 2017-07-08 NOTE — Assessment & Plan Note (Addendum)
Overall doing well with dietary changes. rx for pepcid 40mg  daily as needed. Patient did not want to have to take a medication every day for GERD. Wanted a medication for demand use only.   Will check for H.pylori eradication given chronic anticoagulation needs.

## 2017-07-08 NOTE — Assessment & Plan Note (Addendum)
Overdue for surveillance colonoscopy. Plan on colonoscopy with deep sedation given polypharmacy.  I have discussed the risks, alternatives, benefits with regards to but not limited to the risk of reaction to medication, bleeding, infection, perforation and the patient is agreeable to proceed. Written consent to be obtained.  I have discussed the risks, alternatives, benefits with regards to but not limited to the risk of reaction to medication, bleeding, infection, perforation and the patient is agreeable to proceed. Written consent to be obtained.  Patient with chronic diarrhea. Possible bile acid diarrhea, diabetic enteropathy, related to metformin. Consider random colon biopsies.   HOLD Xarelto 48 hours before colonoscopy.

## 2017-07-08 NOTE — Progress Notes (Signed)
Primary Care Physician:  Redmond School, MD  Primary Gastroenterologist:  Garfield Cornea, MD   Chief Complaint  Patient presents with  . Consult    TCS. on xarelto    HPI:  Garrett Clements is a 62 y.o. male here to schedule surveillance colonoscopy.  He was due in March 2015.  Back in March 2012 he had a tubulovillous adenoma removed from his colon.  Presents with his daughter-in-law, Stellan Vick, who helps him with his medications and medical issues. patient complains of postpandrial loose stools since gallbladder removed in 2006. Occasional constipation but not frequently. Some nocturnal stools. Has been on metformin about couple of years but dosage in 12/2016. Not sure if diarrhea worse since then. Daughter in law took began helping him around that time.   He has had single episode of brbpr, ?hemorrhoids. No recent weight loss. Some weight loss prior to that from uncontrolled diabetes. Unknown amount. He has some heartburn with certain foods. No vomiting. Usually controls with avoiding food triggers. But needs something for the days he has it. He also complains of ringing in his ears and ear pain.   Remotely treated for H.pylori, consider checking for eradication.   Patient has a history of nausea/vomiting/abdominal pain/diarrhea requiring ED visit back in November.  CT abdomen pelvis with contrast was unremarkable.  He had atherosclerotic calcifications but no hemodynamically significant stenoses within the abdominal aorta.  Current Outpatient Medications  Medication Sig Dispense Refill  . atorvastatin (LIPITOR) 20 MG tablet Take 20 mg by mouth every morning.    . clonazePAM (KLONOPIN) 0.5 MG tablet Take 0.5 mg by mouth daily as needed for anxiety.    . gabapentin (NEURONTIN) 300 MG capsule Take 300 mg by mouth 3 (three) times daily.    Marland Kitchen glipiZIDE (GLUCOTROL) 10 MG tablet Take 10 mg by mouth every morning.  2  . lisinopril (PRINIVIL,ZESTRIL) 5 MG tablet Take 5 mg by mouth every  morning.    . loperamide (IMODIUM A-D) 2 MG tablet Take 1 tablet (2 mg total) by mouth 4 (four) times daily as needed for diarrhea or loose stools. 30 tablet 0  . metFORMIN (GLUCOPHAGE) 500 MG tablet TAKE 2 TABLETS BY MOUTH TWICE DAILY FOR 90 DAYS  0  . metoprolol succinate (TOPROL XL) 25 MG 24 hr tablet Take 1 tablet (25 mg total) by mouth daily. 90 tablet 3  . rivaroxaban (XARELTO) 20 MG TABS tablet Take 1 tablet (20 mg total) by mouth daily with supper. 30 tablet 6  . sertraline (ZOLOFT) 25 MG tablet Take 25 mg by mouth daily.     No current facility-administered medications for this visit.     Allergies as of 07/08/2017  . (No Known Allergies)    Past Medical History:  Diagnosis Date  . Anxiety   . Atrial fibrillation [facility])    Diagnosed February 2016  . Chronic back pain   . Depression   . Essential hypertension   . GERD (gastroesophageal reflux disease)   . H. pylori infection 07/2011  . Hiatal hernia   . Memory deficits   . Migraines   . Tubulovillous adenoma of colon 05/23/10  . Type 2 diabetes mellitus ([place])     Past Surgical History:  Procedure Laterality Date  . CHOLECYSTECTOMY  2006  . COLONOSCOPY  05/15/10   Rourk-friable anal canal, 1cm pedunculated TVA  mid-sigmoid , diminutive adjacent polyp ablated  . ESOPHAGOGASTRODUODENOSCOPY  07/20/2011   Dr. Gala Romney- normal esophagus-s/p maloney dialation, small hiatal hernia, hpylori +  .  UMBILICAL HERNIA REPAIR  2006    Family History  Problem Relation Age of Onset  . Diabetes Mother   . Osteoporosis Father   . Ulcers Father   . Prostate cancer Father   . Diabetes Brother   . Diabetes Brother   . Cancer Maternal Grandfather   . Colon cancer Neg Hx     Social History   Socioeconomic History  . Marital status: Divorced    Spouse name: Not on file  . Number of children: 2  . Years of education: Not on file  . Highest education level: Not on file  Occupational History  . Occupation: Disabled    Fish farm manager: NOT  EMPLOYED  Social Needs  . Financial resource strain: Not on file  . Food insecurity:    Worry: Not on file    Inability: Not on file  . Transportation needs:    Medical: Not on file    Non-medical: Not on file  Tobacco Use  . Smoking status: Current Every Day Smoker    Packs/day: 2.00    Years: 45.00    Pack years: 90.00    Types: Cigarettes    Start date: 05/19/1968  . Smokeless tobacco: Never Used  Substance and Sexual Activity  . Alcohol use: No    Alcohol/week: 0.0 oz    Frequency: Never    Comment: quit; used to be heavy drinker but quit 15 years  . Drug use: No  . Sexual activity: Not on file  Lifestyle  . Physical activity:    Days per week: Not on file    Minutes per session: Not on file  . Stress: Not on file  Relationships  . Social connections:    Talks on phone: Not on file    Gets together: Not on file    Attends religious service: Not on file    Active member of club or organization: Not on file    Attends meetings of clubs or organizations: Not on file    Relationship status: Not on file  . Intimate partner violence:    Fear of current or ex partner: Not on file    Emotionally abused: Not on file    Physically abused: Not on file    Forced sexual activity: Not on file  Other Topics Concern  . Not on file  Social History Narrative   Lives w/ mother      ROS:  General: Negative for anorexia, weight loss, fever, chills, fatigue, weakness. See hpi Eyes: Negative for vision changes.  ENT: Negative for hoarseness, difficulty swallowing , nasal congestion. CV: Negative for chest pain, angina, palpitations, dyspnea on exertion, peripheral edema.  Respiratory: Negative for dyspnea at rest, dyspnea on exertion, cough, sputum, wheezing.  GI: See history of present illness. GU:  Negative for dysuria, hematuria, urinary incontinence, urinary frequency, nocturnal urination.  MS: chronic back pain. Derm: Negative for rash or itching.  Neuro: Negative for  weakness, abnormal sensation, seizure, frequent headaches, memory loss, confusion.  Psych: Negative for anxiety, depression, suicidal ideation, hallucinations.  Endo: Negative for unusual weight change.  Heme: Negative for bruising or bleeding. Allergy: Negative for rash or hives.    Physical Examination:  BP 112/71   Pulse 64   Temp (!) 97 F (36.1 C) (Oral)   Ht 5\' 8"  (1.727 m)   Wt 188 lb 12.8 oz (85.6 kg)   BMI 28.71 kg/m    General: Well-nourished, well-developed in no acute distress. Accompanied by daughter in Sports coach, [employer]  [place] Head: Normocephalic, atraumatic.   Eyes: Conjunctiva pink, no icterus. Mouth: Oropharyngeal mucosa moist and pink , no lesions erythema or exudate. Neck: Supple without thyromegaly, masses, or lymphadenopathy.  Lungs: Clear to auscultation bilaterally.  Heart: Regular rate and rhythm, no murmurs rubs or gallops.  Abdomen: Bowel sounds are normal, nontender, nondistended, no hepatosplenomegaly or masses, no abdominal bruits or    hernia , no rebound or guarding.   Rectal: not performed Extremities: No lower extremity edema. No clubbing or deformities.  Neuro: Alert and oriented x 4 , grossly normal neurologically.  Skin: Warm and dry, no rash or jaundice.   Psych: Alert and cooperative, normal mood and affect.  Labs: Lab Results  Component Value Date   CREATININE 0.83 05/01/2017   BUN 27 (H) 05/01/2017   NA 135 05/01/2017   K 4.4 05/01/2017   CL 98 (L) 05/01/2017   CO2 23 05/01/2017   Lab Results  Component Value Date   ALT 17 04/09/2017   AST 19 04/09/2017   ALKPHOS 61 04/09/2017   BILITOT 1.2 04/09/2017   Lab Results  Component Value Date   WBC 17.0 (H) 05/01/2017   HGB 15.4 05/01/2017   HCT 45.8 05/01/2017   MCV 88.2 05/01/2017   PLT 362 05/01/2017   Lab Results  Component Value Date   FERRITIN 236 04/09/2017     Imaging Studies: No results found.

## 2017-07-08 NOTE — Telephone Encounter (Signed)
Tried to call pt's daughter Luetta Nutting) to inform of pre-op appt 08/27/17 at 11:00am, no answer, no VM.  Daughter called office and was informed of pre-op appt. Letter mailed.

## 2017-07-08 NOTE — Patient Instructions (Signed)
1. Take Pepcid once daily as needed for heartburn. 2. Take Bentyl 30 minutes before a meal to prevent diarrhea. HOLD for constipation. DO NOT TAKE THE WEEK BEFORE YOUR BOWEL PREP.  3. Colonoscopy in near future. See separate instructions.  4. Consider seeing ENT for persistent ringing in the ears and pain in ears with swallowing.  5. Please complete H.pylori breath test (TO MAKE SURE WE GOT RID OF THE INFECTION IN YOUR STOMACH THAT WE FOUND SEVERAL YEARS AGO). You need to have breath test done before you start taking Pepcid as Pepcid and other antacids can mess up the test.

## 2017-07-08 NOTE — Progress Notes (Signed)
CC'D TO PCP °

## 2017-07-10 NOTE — Progress Notes (Signed)
Cardiology Office Note    Date:  07/15/2017   ID:  Warnell, Rasnic April 30, 1955, MRN [id number]  PCP:  Redmond School, MD  Cardiologist: Rozann Lesches, MD  Chief Complaint  Patient presents with  . Follow-up    History of Present Illness:  Garrett Clements is a 62 y.o. male history of persistent atrial fibrillation on Xarelto for CHA2DS2-VASc of 2 in 2016.  2D echo then showed a normal LVEF 55 to 60% he has not seen Dr. Domenic Polite until 2018 at which time he had been off his Xarelto and this was restarted.  Also has hypertension, HLD and diabetes type 2.    Last saw Dr. Domenic Polite 05/03/2017 after he developed chest discomfort and shortness of breath with transient rash and swelling of his throat after a spinal injection.  Troponins were normal and he also had atrial fibrillation at that time.  Toprol was added for rate control.  If he has further chest pain or shortness of breath ischemic work-up was recommended.  Patient comes in today for follow-up accompanied by his daughter.  He complains of chest tightness at times with activity such as working in his yard.  If he sits down it goes away.  Sometimes his heart is going fast other times it is not.  He continues to smoke a pack of cigarettes daily.  Not willing to quit.    Past Medical History:  Diagnosis Date  . Anxiety   . Atrial fibrillation [facility])    Diagnosed February 2016  . Chronic back pain   . Depression   . Essential hypertension   . GERD (gastroesophageal reflux disease)   . H. pylori infection 07/2011  . Hiatal hernia   . Memory deficits   . Migraines   . Tubulovillous adenoma of colon 05/23/10  . Type 2 diabetes mellitus ([place])     Past Surgical History:  Procedure Laterality Date  . CHOLECYSTECTOMY  2006  . COLONOSCOPY  05/15/10   Rourk-friable anal canal, 1cm pedunculated TVA  mid-sigmoid , diminutive adjacent polyp ablated  . ESOPHAGOGASTRODUODENOSCOPY  07/20/2011   Dr. Gala Romney- normal esophagus-s/p maloney  dialation, small hiatal hernia, hpylori +  . UMBILICAL HERNIA REPAIR  2006    Current Medications: Current Meds  Medication Sig  . atorvastatin (LIPITOR) 20 MG tablet Take 20 mg by mouth every morning.  . clonazePAM (KLONOPIN) 0.5 MG tablet Take 0.5 mg by mouth daily as needed for anxiety.  . dicyclomine (BENTYL) 10 MG capsule Take 1 capsule (10 mg total) by mouth 3 (three) times daily with meals. For diarrhea. Hold for constipation.  . famotidine (PEPCID) 40 MG tablet Take 1 tablet (40 mg total) by mouth daily as needed for heartburn or indigestion.  . gabapentin (NEURONTIN) 300 MG capsule Take 300 mg by mouth 3 (three) times daily.  Marland Kitchen glipiZIDE (GLUCOTROL) 10 MG tablet Take 10 mg by mouth every morning.  Marland Kitchen lisinopril (PRINIVIL,ZESTRIL) 5 MG tablet Take 5 mg by mouth every morning.  . loperamide (IMODIUM A-D) 2 MG tablet Take 1 tablet (2 mg total) by mouth 4 (four) times daily as needed for diarrhea or loose stools.  . metFORMIN (GLUCOPHAGE) 500 MG tablet TAKE 2 TABLETS BY MOUTH TWICE DAILY FOR 90 DAYS  . metoprolol succinate (TOPROL XL) 25 MG 24 hr tablet Take 1 tablet (25 mg total) by mouth daily.  . polyethylene glycol-electrolytes (TRILYTE) 420 g solution Take 4,000 mLs by mouth as directed.  . rivaroxaban (XARELTO) 20 MG TABS tablet  Take 1 tablet (20 mg total) by mouth daily with supper.  . sertraline (ZOLOFT) 25 MG tablet Take 25 mg by mouth daily.     Allergies:   Patient has no known allergies.   Social History   Socioeconomic History  . Marital status: Divorced    Spouse name: Not on file  . Number of children: 2  . Years of education: Not on file  . Highest education level: Not on file  Occupational History  . 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 Financial resource strain: Not on file  . Food insecurity:    Worry: Not on file    Inability: Not on file  . Transportation needs:    Medical: Not on file    Non-medical: Not on file  Tobacco  Use  . Smoking status: Current Every Day Smoker    Packs/day: 2.00    Years: 45.00    Pack years: 90.00    Types: Cigarettes    Start date: 05/19/1968  . Smokeless tobacco: Never Used  Substance and Sexual Activity  . Alcohol use: No    Alcohol/week: 0.0 oz    Frequency: Never    Comment: quit; used to be heavy drinker but quit 15 years  . Drug use: No  . Sexual activity: Not on file  Lifestyle  . Physical activity:    Days per week: Not on file    Minutes per session: Not on file  . Stress: Not on file  Relationships  . Social connections:    Talks on phone: Not on file    Gets together: Not on file    Attends religious service: Not on file    Active member of club or organization: Not on file    Attends meetings of clubs or organizations: Not on file    Relationship status: Not on file  Other Topics Concern  . Not on file  Social History Narrative   Lives w/ mother     Family History:  The patient's family history includes Cancer in his maternal grandfather; Diabetes in his brother, brother, and mother; Osteoporosis in his father; Prostate cancer in his father; Ulcers in his father.   ROS:   Please see the history of present illness.    Review of Systems  Constitution: Negative.  HENT: Negative.   Cardiovascular: Positive for chest pain, dyspnea on exertion and irregular heartbeat.  Respiratory: Positive for cough and shortness of breath.   Endocrine: Negative.   Hematologic/Lymphatic: Negative.   Musculoskeletal: Positive for back pain.  Gastrointestinal: Negative.   Genitourinary: Negative.   Neurological: Positive for paresthesias and weakness.       Neuropathy  Psychiatric/Behavioral: The patient is nervous/anxious.    All other systems reviewed and are negative.   PHYSICAL EXAM:   VS:  BP 104/62   Pulse 77   Ht _0  (1.727 m)   Wt 188 lb (85.3 kg)   SpO2 98%   BMI 28.59 kg/m   Physical Exam  GEN: Well nourished, well developed, in no acute  distress  Neck: no JVD, carotid bruits, or masses Cardiac: Irregular irregular; no murmurs, rubs, or gallops  Respiratory:  clear to auscultation bilaterally, normal work of breathing GI: soft, nontender, nondistended, + BS Ext: without cyanosis, clubbing, or edema, Good distal pulses bilaterally Neuro:  Alert and Oriented x 3 Psych: euthymic mood, full affect  Wt Readings from Last 3 Encounters:  07/15/17 188 lb (85.3 kg)  07/08/17 188 lb 12.8 oz (85.6 kg)  05/03/17 190 lb (86.2 kg)      Studies/Labs Reviewed:   EKG:  EKG is not ordered today  Recent Labs: 04/09/2017: ALT 17 05/01/2017: BUN 27; Creatinine, Ser 0.83; Hemoglobin 15.4; Platelets 362; Potassium 4.4; Sodium 135   Lipid Panel No results found for: CHOL, TRIG, HDL, CHOLHDL, VLDL, LDLCALC, LDLDIRECT  Additional studies/ records that were reviewed today include:   Echocardiogram 04/21/2014: Study Conclusions  - Left ventricle: The cavity size was normal. Wall thickness was   increased in a pattern of mild LVH. Systolic function was normal.   The estimated ejection fraction was in the range of 55% to 60%.   Wall motion was normal; there were no regional wall motion   abnormalities. The study is not technically sufficient to allow   evaluation of LV diastolic function. - Aortic valve: Mildly calcified annulus. Trileaflet. There was no   significant regurgitation. - Aortic root: The aortic root was mildly ectatic. - Mitral valve: Calcified annulus. There was trivial regurgitation. - Left atrium: The atrium was mildly dilated. - Right atrium: Central venous pressure (est): 3 mm Hg. - Atrial septum: No defect or patent foramen ovale was identified. - Tricuspid valve: There was trivial regurgitation. - Pulmonary arteries: Systolic pressure could not be accurately   estimated. - Pericardium, extracardiac: There was no pericardial effusion.  Impressions:  - Mild LVH with LVEF 55-60%. Indeterminate diastolic function  in   the setting of atrial fibrillation. Mild left atrial enlargement.   MAC with trivial mitral regurgitation. Mildly ectatic aortic   root. Unable to assess PASP.      ASSESSMENT:    1. Chest pain, unspecified type   2. Persistent atrial fibrillation ([place])   3. Essential hypertension   4. Mixed hyperlipidemia   5. Tobacco abuse   6. Family history of early CAD      PLAN:  In order of problems listed above:  Chest pain with activity multiple cardiac risk factors for CAD including strong family history, DM, hypertension, HLD and ongoing tobacco abuse.  Will proceed with Lexiscan Myoview.  Follow-up with Dr. Domenic Polite or myself in a couple weeks.  Persistent atrial fibrillation on Xarelto, Toprol-XL added last office visit for better rate control-rate controlled  Essential hypertension blood pressure on the low side  Mixed hyperlipidemia continue Lipitor    Medication Adjustments/Labs and Tests Ordered: Current medicines are reviewed at length with the patient today.  Concerns regarding medicines are outlined above.  Medication changes, Labs and Tests ordered today are listed in the Patient Instructions below. Patient Instructions  Medication Instructions:   Your physician recommends that you continue on your current medications as directed. Please refer to the Current Medication list given to you today.  Labwork:  NONE  Testing/Procedures: Your physician has requested that you have a lexiscan myoview. For further information please visit [email or web address]. Please follow instruction sheet, as given  Follow-Up:  Your physician recommends that you schedule a follow-up appointment in: 2 weeks with Domenic Polite or Bonnell Public.  Any Other Special Instructions Will Be Listed Below (If Applicable).  If you need a refill on your cardiac medications before your next appointment, please call your pharmacy.    Sumner Boast, PA-C  07/15/2017 11:37 AM    [facility] [address], [place], [place]  [address] Phone: [phone]; Fax: [phone]

## 2017-07-15 ENCOUNTER — Encounter: Payer: Self-pay | Admitting: *Deleted

## 2017-07-15 ENCOUNTER — Ambulatory Visit: Payer: Medicare HMO | Admitting: Physician Assistant

## 2017-07-15 ENCOUNTER — Encounter: Payer: Self-pay | Admitting: Physician Assistant

## 2017-07-15 VITALS — BP 104/62 | HR 77 | Ht 68.0 in | Wt 188.0 lb

## 2017-07-15 DIAGNOSIS — Z720 Tobacco use: Secondary | ICD-10-CM | POA: Diagnosis not present

## 2017-07-15 DIAGNOSIS — E782 Mixed hyperlipidemia: Secondary | ICD-10-CM | POA: Diagnosis not present

## 2017-07-15 DIAGNOSIS — I10 Essential (primary) hypertension: Secondary | ICD-10-CM

## 2017-07-15 DIAGNOSIS — I481 Persistent atrial fibrillation: Secondary | ICD-10-CM

## 2017-07-15 DIAGNOSIS — R079 Chest pain, unspecified: Secondary | ICD-10-CM

## 2017-07-15 DIAGNOSIS — Z8249 Family history of ischemic heart disease and other diseases of the circulatory system: Secondary | ICD-10-CM | POA: Diagnosis not present

## 2017-07-15 DIAGNOSIS — I4819 Other persistent atrial fibrillation: Secondary | ICD-10-CM

## 2017-07-15 NOTE — Patient Instructions (Signed)
Medication Instructions:   Your physician recommends that you continue on your current medications as directed. Please refer to the Current Medication list given to you today.  Labwork:  NONE  Testing/Procedures: Your physician has requested that you have a lexiscan myoview. For further information please visit [email or web address]. Please follow instruction sheet, as given  Follow-Up:  Your physician recommends that you schedule a follow-up appointment in: 2 weeks with Domenic Polite or Bonnell Public.  Any Other Special Instructions Will Be Listed Below (If Applicable).  If you need a refill on your cardiac medications before your next appointment, please call your pharmacy.

## 2017-07-23 ENCOUNTER — Encounter (HOSPITAL_COMMUNITY): Payer: Self-pay

## 2017-07-23 ENCOUNTER — Encounter (HOSPITAL_BASED_OUTPATIENT_CLINIC_OR_DEPARTMENT_OTHER)
Admission: RE | Admit: 2017-07-23 | Discharge: 2017-07-23 | Disposition: A | Discharge status: Home / Self Care | Payer: Medicare HMO | Source: Ambulatory Visit | Attending: Physician Assistant | Admitting: Physician Assistant

## 2017-07-23 ENCOUNTER — Encounter (HOSPITAL_COMMUNITY)
Admission: RE | Admit: 2017-07-23 | Discharge: 2017-07-23 | Disposition: A | Discharge status: Home / Self Care | Payer: Medicare HMO | Source: Ambulatory Visit | Attending: Physician Assistant | Admitting: Physician Assistant

## 2017-07-23 DIAGNOSIS — R079 Chest pain, unspecified: Secondary | ICD-10-CM

## 2017-07-23 DIAGNOSIS — E782 Mixed hyperlipidemia: Secondary | ICD-10-CM | POA: Diagnosis not present

## 2017-07-23 DIAGNOSIS — I481 Persistent atrial fibrillation: Secondary | ICD-10-CM

## 2017-07-23 DIAGNOSIS — I10 Essential (primary) hypertension: Secondary | ICD-10-CM

## 2017-07-23 DIAGNOSIS — I4819 Other persistent atrial fibrillation: Secondary | ICD-10-CM

## 2017-07-23 LAB — NM MYOCAR MULTI W/SPECT W/WALL MOTION / EF
CHL CUP NUCLEAR SDS: 0
CHL CUP NUCLEAR SSS: 5
CSEPPHR: 101 {beats}/min
LV dias vol: 87 mL (ref 62–150)
LV sys vol: 32 mL
RATE: 0.44
Rest HR: 73 {beats}/min
SRS: 5
TID: 1.17

## 2017-07-23 MED ORDER — TECHNETIUM TC 99M TETROFOSMIN IV KIT
10.0000 | PACK | Freq: Once | INTRAVENOUS | Status: AC | PRN
  Administered 2017-07-23: 11 via INTRAVENOUS

## 2017-07-23 MED ORDER — TECHNETIUM TC 99M TETROFOSMIN IV KIT
30.0000 | PACK | Freq: Once | INTRAVENOUS | Status: AC | PRN
  Administered 2017-07-23: 33 via INTRAVENOUS

## 2017-07-23 MED ORDER — REGADENOSON 0.4 MG/5ML IV SOLN
INTRAVENOUS | Status: AC
  Administered 2017-07-23: 0.4 mg via INTRAVENOUS
  Filled 2017-07-23: qty 5

## 2017-07-23 MED ORDER — SODIUM CHLORIDE 0.9% FLUSH
INTRAVENOUS | Status: AC
  Administered 2017-07-23: 10 mL via INTRAVENOUS
  Filled 2017-07-23: qty 10

## 2017-07-31 DIAGNOSIS — R079 Chest pain, unspecified: Secondary | ICD-10-CM | POA: Insufficient documentation

## 2017-07-31 NOTE — Progress Notes (Deleted)
Cardiology Office Note    Date:  07/31/2017   ID:  Dave, Mergen Nov 24, 1955, MRN [id number]  PCP:  Redmond School, MD  Cardiologist: Rozann Lesches, MD  No chief complaint on file.   History of Present Illness:  Garrett Clements is a 62 y.o. male with ahistory of persistent atrial fibrillation on Xarelto for CHA2DS2-VASc of 2 in 2016.  2D echo then showed a normal LVEF 55 to 60% he has not seen Dr. Domenic Polite until 2018 at which time he had been off his Xarelto and this was restarted.  Also has hypertension, HLD and diabetes type 2.   Last saw Dr. Domenic Polite 05/03/2017 after he developed chest discomfort and shortness of breath with transient rash and swelling of his throat after a spinal injection.  Troponins were normal and he also had atrial fibrillation at that time.  Toprol was added for rate control.  If he has further chest pain or shortness of breath ischemic work-up was recommended.   I saw the patient 07/15/2017 with recurrent exertional chest tightness as well as heart racing.  He continued to smoke a pack of cigarettes daily was not willing to quit.  Heart rate was controlled that day.  Nuclear stress test shows small mild intensity inferolateral defect that is most prominent on rest from apex to base suggestive of variable soft tissue attenuation rather than scar no large ischemic territories noted.  No ST changes low risk study EF 63%.    Past Medical History:  Diagnosis Date  . Anxiety   . Atrial fibrillation [facility])    Diagnosed February 2016  . Chronic back pain   . Depression   . Essential hypertension   . GERD (gastroesophageal reflux disease)   . H. pylori infection 07/2011  . Hiatal hernia   . Memory deficits   . Migraines   . Tubulovillous adenoma of colon 05/23/10  . Type 2 diabetes mellitus ([place])     Past Surgical History:  Procedure Laterality Date  . CHOLECYSTECTOMY  2006  . COLONOSCOPY  05/15/10   Rourk-friable anal canal, 1cm pedunculated TVA   mid-sigmoid , diminutive adjacent polyp ablated  . ESOPHAGOGASTRODUODENOSCOPY  07/20/2011   Dr. Gala Romney- normal esophagus-s/p maloney dialation, small hiatal hernia, hpylori +  . UMBILICAL HERNIA REPAIR  2006    Current Medications: No outpatient medications have been marked as taking for the 08/05/17 encounter (Appointment) with [name], PA-C.     Allergies:   Patient has no known allergies.   Social History   Socioeconomic History  . Marital status: Divorced    Spouse name: Not on file  . Number of children: 2  . Years of education: Not on file  . Highest education level: Not on file  Occupational History  . 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 Financial resource strain: Not on file  . Food insecurity:    Worry: Not on file    Inability: Not on file  . Transportation needs:    Medical: Not on file    Non-medical: Not on file  Tobacco Use  . Smoking status: Current Every Day Smoker    Packs/day: 2.00    Years: 45.00    Pack years: 90.00    Types: Cigarettes    Start date: 05/19/1968  . Smokeless tobacco: Never Used  Substance and Sexual Activity  . Alcohol use: No    Alcohol/week: 0.0 oz    Frequency: Never  Comment: quit; used to be heavy drinker but quit 15 years  . Drug use: No  . Sexual activity: Not on file  Lifestyle  . Physical activity:    Days per week: Not on file    Minutes per session: Not on file  . Stress: Not on file  Relationships  . Social connections:    Talks on phone: Not on file    Gets together: Not on file    Attends religious service: Not on file    Active member of club or organization: Not on file    Attends meetings of clubs or organizations: Not on file    Relationship status: Not on file  Other Topics Concern  . Not on file  Social History Narrative   Lives w/ mother     Family History:  The patient's ***family history includes Cancer in his maternal grandfather; Diabetes in his brother,  brother, and mother; Osteoporosis in his father; Prostate cancer in his father; Ulcers in his father.   ROS:   Please see the history of present illness.    ROS All other systems reviewed and are negative.   PHYSICAL EXAM:   VS:  There were no vitals taken for this visit.  Physical Exam  GEN: Well nourished, well developed, in no acute distress  HEENT: normal  Neck: no JVD, carotid bruits, or masses Cardiac:RRR; no murmurs, rubs, or gallops  Respiratory:  clear to auscultation bilaterally, normal work of breathing GI: soft, nontender, nondistended, + BS Ext: without cyanosis, clubbing, or edema, Good distal pulses bilaterally MS: no deformity or atrophy  Skin: warm and dry, no rash Neuro:  Alert and Oriented x 3, Strength and sensation are intact Psych: euthymic mood, full affect  Wt Readings from Last 3 Encounters:  07/15/17 188 lb (85.3 kg)  07/08/17 188 lb 12.8 oz (85.6 kg)  05/03/17 190 lb (86.2 kg)      Studies/Labs Reviewed:   EKG:  EKG is*** ordered today.  The ekg ordered today demonstrates ***  Recent Labs: 04/09/2017: ALT 17 05/01/2017: BUN 27; Creatinine, Ser 0.83; Hemoglobin 15.4; Platelets 362; Potassium 4.4; Sodium 135   Lipid Panel No results found for: CHOL, TRIG, HDL, CHOLHDL, VLDL, LDLCALC, LDLDIRECT  Additional studies/ records that were reviewed today include:  Nuclear stress test 07/15/2017  Small, mild intensity, inferolateral defect that is most prominent on rest imaging from apex to base and suggestive of variable soft tissue attenuation rather than scar. No large ischemic territories noted.  No diagnostic ST segment changes to indicate ischemia.  This is a low risk study.  Nuclear stress EF: 63%.      ASSESSMENT:    1. Persistent atrial fibrillation (HCC)   2. Chest pain, unspecified type   3. Essential hypertension   4. Tobacco abuse   5. Mixed hyperlipidemia      PLAN:  In order of problems listed above:  Chest pain with  multiple CV risk factors.  Nuclear stress test reassuring negative for ischemia low risk, normal LV function.  Persistent atrial fibrillation on metoprolol and Xarelto  Essential hypertension  Tobacco abuse refuses to quit  Mixed hyperlipidemia    Medication Adjustments/Labs and Tests Ordered: Current medicines are reviewed at length with the patient today.  Concerns regarding medicines are outlined above.  Medication changes, Labs and Tests ordered today are listed in the Patient Instructions below. There are no Patient Instructions on file for this visit.   Sumner Boast, PA-C  07/31/2017 3:37 PM  [facility] [address], [place], [place]  [address] Phone: [phone]; Fax: [phone]

## 2017-08-05 ENCOUNTER — Ambulatory Visit: Payer: Medicare HMO | Admitting: Physician Assistant

## 2017-08-21 NOTE — Progress Notes (Deleted)
Cardiology Office Note    Date:  08/21/2017   ID:  Garrett Clements, Garrett Clements Jan 29, 1956, MRN [id number]  PCP:  Redmond School, MD  Cardiologist: Rozann Lesches, MD  No chief complaint on file.   History of Present Illness:  Garrett Clements is a 62 y.o. male with a history of persistent atrial fibrillation on Xarelto for CHA2DS2-VASc of 2 in 2016.  2D echo then showed a normal LVEF 55 to 60% he has not seen Dr. Domenic Polite until 2018 at which time he had been off his Xarelto and this was restarted.  Also has hypertension, HLD and diabetes type 2.   Last saw Dr. Domenic Polite 05/03/2017 after he developed chest discomfort and shortness of breath with transient rash and swelling of his throat after a spinal injection.  Troponins were normal and he also had atrial fibrillation at that time.  Toprol was added for rate control.  If he has further chest pain or shortness of breath ischemic work-up was recommended.   I saw the patient complaining of exertional chest pain worrisome for ischemia.  Carlton Adam was a low risk study LVEF 63% with small mild intensity inferolateral defect most prominent in rest imaging suggestive of soft tissue attenuation rather than scar.  No ischemic territories noted.    Past Medical History:  Diagnosis Date  . Anxiety   . Atrial fibrillation [facility])    Diagnosed February 2016  . Chronic back pain   . Depression   . Essential hypertension   . GERD (gastroesophageal reflux disease)   . H. pylori infection 07/2011  . Hiatal hernia   . Memory deficits   . Migraines   . Tubulovillous adenoma of colon 05/23/10  . Type 2 diabetes mellitus ([place])     Past Surgical History:  Procedure Laterality Date  . CHOLECYSTECTOMY  2006  . COLONOSCOPY  05/15/10   Rourk-friable anal canal, 1cm pedunculated TVA  mid-sigmoid , diminutive adjacent polyp ablated  . ESOPHAGOGASTRODUODENOSCOPY  07/20/2011   Dr. Gala Romney- normal esophagus-s/p maloney dialation, small hiatal hernia, hpylori +  .  UMBILICAL HERNIA REPAIR  2006    Current Medications: No outpatient medications have been marked as taking for the 09/02/17 encounter (Appointment) with [name], PA-C.     Allergies:   Patient has no known allergies.   Social History   Socioeconomic History  . Marital status: Divorced    Spouse name: Not on file  . Number of children: 2  . Years of education: Not on file  . Highest education level: Not on file  Occupational History  . 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 Financial resource strain: Not on file  . Food insecurity:    Worry: Not on file    Inability: Not on file  . Transportation needs:    Medical: Not on file    Non-medical: Not on file  Tobacco Use  . Smoking status: Current Every Day Smoker    Packs/day: 2.00    Years: 45.00    Pack years: 90.00    Types: Cigarettes    Start date: 05/19/1968  . Smokeless tobacco: Never Used  Substance and Sexual Activity  . Alcohol use: No    Alcohol/week: 0.0 oz    Frequency: Never    Comment: quit; used to be heavy drinker but quit 15 years  . Drug use: No  . Sexual activity: Not on file  Lifestyle  . Physical activity:    Days  per week: Not on file    Minutes per session: Not on file  . Stress: Not on file  Relationships  . Social connections:    Talks on phone: Not on file    Gets together: Not on file    Attends religious service: Not on file    Active member of club or organization: Not on file    Attends meetings of clubs or organizations: Not on file    Relationship status: Not on file  Other Topics Concern  . Not on file  Social History Narrative   Lives w/ mother     Family History:  The patient's ***family history includes Cancer in his maternal grandfather; Diabetes in his brother, brother, and mother; Osteoporosis in his father; Prostate cancer in his father; Ulcers in his father.   ROS:   Please see the history of present illness.    ROS All other systems  reviewed and are negative.   PHYSICAL EXAM:   VS:  There were no vitals taken for this visit.  Physical Exam  GEN: Well nourished, well developed, in no acute distress  HEENT: normal  Neck: no JVD, carotid bruits, or masses Cardiac:RRR; no murmurs, rubs, or gallops  Respiratory:  clear to auscultation bilaterally, normal work of breathing GI: soft, nontender, nondistended, + BS Ext: without cyanosis, clubbing, or edema, Good distal pulses bilaterally MS: no deformity or atrophy  Skin: warm and dry, no rash Neuro:  Alert and Oriented x 3, Strength and sensation are intact Psych: euthymic mood, full affect  Wt Readings from Last 3 Encounters:  07/15/17 188 lb (85.3 kg)  07/08/17 188 lb 12.8 oz (85.6 kg)  05/03/17 190 lb (86.2 kg)      Studies/Labs Reviewed:   EKG:  EKG is*** ordered today.  The ekg ordered today demonstrates ***  Recent Labs: 04/09/2017: ALT 17 05/01/2017: BUN 27; Creatinine, Ser 0.83; Hemoglobin 15.4; Platelets 362; Potassium 4.4; Sodium 135   Lipid Panel No results found for: CHOL, TRIG, HDL, CHOLHDL, VLDL, LDLCALC, LDLDIRECT  Additional studies/ records that were reviewed today include:  Lexiscan Myoview 07/23/2017    Small, mild intensity, inferolateral defect that is most prominent on rest imaging from apex to base and suggestive of variable soft tissue attenuation rather than scar. No large ischemic territories noted.  No diagnostic ST segment changes to indicate ischemia.  This is a low risk study.  Nuclear stress EF: 63%.   Echocardiogram 04/21/2014: Study Conclusions  - Left ventricle: The cavity size was normal. Wall thickness was   increased in a pattern of mild LVH. Systolic function was normal.   The estimated ejection fraction was in the range of 55% to 60%.   Wall motion was normal; there were no regional wall motion   abnormalities. The study is not technically sufficient to allow   evaluation of LV diastolic function. - Aortic  valve: Mildly calcified annulus. Trileaflet. There was no   significant regurgitation. - Aortic root: The aortic root was mildly ectatic. - Mitral valve: Calcified annulus. There was trivial regurgitation. - Left atrium: The atrium was mildly dilated. - Right atrium: Central venous pressure (est): 3 mm Hg. - Atrial septum: No defect or patent foramen ovale was identified. - Tricuspid valve: There was trivial regurgitation. - Pulmonary arteries: Systolic pressure could not be accurately   estimated. - Pericardium, extracardiac: There was no pericardial effusion.  Impressions:  - Mild LVH with LVEF 55-60%. Indeterminate diastolic function in   the setting of atrial  fibrillation. Mild left atrial enlargement.   MAC with trivial mitral regurgitation. Mildly ectatic aortic   root. Unable to assess PASP.        ASSESSMENT:    1. Chest pain, unspecified type   2. Persistent atrial fibrillation ([place])   3. Essential hypertension   4. Mixed hyperlipidemia   5. Tobacco abuse      PLAN:  In order of problems listed above:  Chest pain with multiple cardiovascular risk factors.  Lexiscan Myoview was low risk with no large ischemic territories  Persistent atrial fibrillation on Xarelto.  Toprol was increased for better rate control in the past.  Essential hypertension  Mixed hyperlipidemia on Lipitor      Medication Adjustments/Labs and Tests Ordered: Current medicines are reviewed at length with the patient today.  Concerns regarding medicines are outlined above.  Medication changes, Labs and Tests ordered today are listed in the Patient Instructions below. There are no Patient Instructions on file for this visit.   Sumner Boast, PA-C  08/21/2017 3:34 PM    [facility] [place], [place], [place]  [address] Phone: [phone]; Fax: [phone]

## 2017-08-23 ENCOUNTER — Other Ambulatory Visit: Payer: Self-pay | Admitting: Cardiology

## 2017-08-23 ENCOUNTER — Encounter (HOSPITAL_COMMUNITY): Payer: Self-pay | Admitting: Hematology

## 2017-08-23 ENCOUNTER — Other Ambulatory Visit: Payer: Self-pay

## 2017-08-23 ENCOUNTER — Inpatient Hospital Stay (HOSPITAL_COMMUNITY): Payer: Medicare HMO | Attending: Hematology | Admitting: Hematology

## 2017-08-23 ENCOUNTER — Inpatient Hospital Stay (HOSPITAL_COMMUNITY): Payer: Medicare HMO

## 2017-08-23 VITALS — BP 94/66 | HR 58 | Resp 16 | Wt 190.1 lb

## 2017-08-23 DIAGNOSIS — E119 Type 2 diabetes mellitus without complications: Secondary | ICD-10-CM | POA: Insufficient documentation

## 2017-08-23 DIAGNOSIS — D72829 Elevated white blood cell count, unspecified: Secondary | ICD-10-CM

## 2017-08-23 DIAGNOSIS — F17210 Nicotine dependence, cigarettes, uncomplicated: Secondary | ICD-10-CM

## 2017-08-23 DIAGNOSIS — I10 Essential (primary) hypertension: Secondary | ICD-10-CM | POA: Diagnosis not present

## 2017-08-23 DIAGNOSIS — D473 Essential (hemorrhagic) thrombocythemia: Secondary | ICD-10-CM

## 2017-08-23 DIAGNOSIS — G8929 Other chronic pain: Secondary | ICD-10-CM | POA: Insufficient documentation

## 2017-08-23 DIAGNOSIS — D75839 Thrombocytosis, unspecified: Secondary | ICD-10-CM

## 2017-08-23 LAB — BASIC METABOLIC PANEL
Anion gap: 7 (ref 5–15)
BUN: 18 mg/dL (ref 8–23)
CHLORIDE: 104 mmol/L (ref 98–111)
CO2: 26 mmol/L (ref 22–32)
Calcium: 9.3 mg/dL (ref 8.9–10.3)
Creatinine, Ser: 0.96 mg/dL (ref 0.61–1.24)
GFR calc non Af Amer: >60 mL/min (ref >60)
Glucose, Bld: 160 mg/dL — ABNORMAL HIGH (ref 70–99)
POTASSIUM: 4.2 mmol/L (ref 3.5–5.1)
SODIUM: 137 mmol/L (ref 135–145)

## 2017-08-23 LAB — CBC WITH DIFFERENTIAL/PLATELET
BASOS PCT: 0 %
Basophils Absolute: 0 10*3/uL (ref 0.0–0.1)
EOS ABS: 0.3 10*3/uL (ref 0.0–0.7)
Eosinophils Relative: 2 %
HEMATOCRIT: 44.6 % (ref 39.0–52.0)
HEMOGLOBIN: 15.2 g/dL (ref 13.0–17.0)
LYMPHS ABS: 3.7 10*3/uL (ref 0.7–4.0)
Lymphocytes Relative: 30 %
MCH: 30.6 pg (ref 26.0–34.0)
MCHC: 34.1 g/dL (ref 30.0–36.0)
MCV: 89.9 fL (ref 78.0–100.0)
MONOS PCT: 8 %
Monocytes Absolute: 1 10*3/uL (ref 0.1–1.0)
NEUTROS ABS: 7.3 10*3/uL (ref 1.7–7.7)
NEUTROS PCT: 60 %
Platelets: 320 10*3/uL (ref 150–400)
RBC: 4.96 MIL/uL (ref 4.22–5.81)
RDW: 13.3 % (ref 11.5–15.5)
WBC: 12.3 10*3/uL — AB (ref 4.0–10.5)

## 2017-08-23 NOTE — Progress Notes (Signed)
Garrett Clements, [place] [id number]   CLINIC:  Medical Oncology/Hematology  PCP:  Redmond School, [place] [address] [phone]   REASON FOR VISIT:  Follow-up for leukocytosis and CT scan results.  CURRENT THERAPY: Observation.   INTERVAL HISTORY:  Garrett Clements 62 y.o. male returns for follow-up of blood work and CT scan results.  He is continuing to smoke 1 pack of cigarettes per day.  He has smoked since he was age 55.  Denies any fevers, night sweats or weight loss.  Denies any new abdominal pains.  Fatigue has been stable.  Numbness in the feet has also been stable.  Denies any recurrent infections or recent hospitalizations.    REVIEW OF SYSTEMS:  Review of Systems  Constitutional: Positive for fatigue.  Neurological: Positive for numbness.  All other systems reviewed and are negative.    PAST MEDICAL/SURGICAL HISTORY:  Past Medical History:  Diagnosis Date  . Anxiety   . Atrial fibrillation [facility])    Diagnosed February 2016  . Chronic back pain   . Depression   . Essential hypertension   . GERD (gastroesophageal reflux disease)   . H. pylori infection 07/2011  . Hiatal hernia   . Memory deficits   . Migraines   . Tubulovillous adenoma of colon 05/23/10  . Type 2 diabetes mellitus ([employer])    Past Surgical History:  Procedure Laterality Date  . CHOLECYSTECTOMY  2006  . COLONOSCOPY  05/15/10   Rourk-friable anal canal, 1cm pedunculated TVA  mid-sigmoid , diminutive adjacent polyp ablated  . ESOPHAGOGASTRODUODENOSCOPY  07/20/2011   Dr. Gala Romney- normal esophagus-s/p maloney dialation, small hiatal hernia, hpylori +  . UMBILICAL HERNIA REPAIR  2006     SOCIAL HISTORY:  Social History   Socioeconomic History  . Marital status: Divorced    Spouse name: Not on file  . Number of children: 2  . Years of education: Not on file  . Highest education level: Not on file  Occupational History  . Occupation:  Disabled    Fish farm manager: NOT EMPLOYED  Social Needs  . Financial resource strain: Not on file  . Food insecurity:    Worry: Not on file    Inability: Not on file  . Transportation needs:    Medical: Not on file    Non-medical: Not on file  Tobacco Use  . Smoking status: Current Every Day Smoker    Packs/day: 2.00    Years: 45.00    Pack years: 90.00    Types: Cigarettes    Start date: 05/19/1968  . Smokeless tobacco: Never Used  Substance and Sexual Activity  . Alcohol use: No    Alcohol/week: 0.0 oz    Frequency: Never    Comment: quit; used to be heavy drinker but quit 15 years  . Drug use: No  . Sexual activity: Not on file  Lifestyle  . Physical activity:    Days per week: Not on file    Minutes per session: Not on file  . Stress: Not on file  Relationships  . Social connections:    Talks on phone: Not on file    Gets together: Not on file    Attends religious service: Not on file    Active member of club or organization: Not on file    Attends meetings of clubs or organizations: Not on file    Relationship status: Not on file  . Intimate partner violence:  Fear of current or ex partner: Not on file    Emotionally abused: Not on file    Physically abused: Not on file    Forced sexual activity: Not on file  Other Topics Concern  . Not on file  Social History Narrative   Lives w/ mother    FAMILY HISTORY:  Family History  Problem Relation Age of Onset  . Diabetes Mother   . Osteoporosis Father   . Ulcers Father   . Prostate cancer Father   . Diabetes Brother   . Diabetes Brother   . Cancer Maternal Grandfather   . Colon cancer Neg Hx     CURRENT MEDICATIONS:  Outpatient Encounter Medications as of 08/23/2017  Medication Sig  . atorvastatin (LIPITOR) 20 MG tablet Take 20 mg by mouth every morning.  . clonazePAM (KLONOPIN) 0.5 MG tablet Take 0.5 mg by mouth daily as needed for anxiety.  . gabapentin (NEURONTIN) 300 MG capsule Take 300 mg by mouth 3  (three) times daily.  Marland Kitchen glipiZIDE (GLUCOTROL) 10 MG tablet Take 10 mg by mouth every morning.  Marland Kitchen lisinopril (PRINIVIL,ZESTRIL) 5 MG tablet Take 5 mg by mouth every morning.  . loperamide (IMODIUM A-D) 2 MG tablet Take 1 tablet (2 mg total) by mouth 4 (four) times daily as needed for diarrhea or loose stools.  . metFORMIN (GLUCOPHAGE) 500 MG tablet TAKE 2 TABLETS BY MOUTH TWICE DAILY FOR 90 DAYS  . metoprolol succinate (TOPROL XL) 25 MG 24 hr tablet Take 1 tablet (25 mg total) by mouth daily.  . sertraline (ZOLOFT) 50 MG tablet   . XARELTO 20 MG TABS tablet TAKE 1 TABLET DAILY WITH SUPPER.  . [DISCONTINUED] dicyclomine (BENTYL) 10 MG capsule Take 1 capsule (10 mg total) by mouth 3 (three) times daily with meals. For diarrhea. Hold for constipation.  . [DISCONTINUED] famotidine (PEPCID) 40 MG tablet Take 1 tablet (40 mg total) by mouth daily as needed for heartburn or indigestion.  . [DISCONTINUED] polyethylene glycol-electrolytes (TRILYTE) 420 g solution Take 4,000 mLs by mouth as directed.  . [DISCONTINUED] sertraline (ZOLOFT) 25 MG tablet Take 25 mg by mouth daily.   No facility-administered encounter medications on file as of 08/23/2017.     ALLERGIES:  No Known Allergies   PHYSICAL EXAM:  ECOG Performance status: 1  Vitals:   08/23/17 1357  BP: 94/66  Pulse: (!) 58  Resp: 16  SpO2: 100%   Filed Weights   08/23/17 1357  Weight: 190 lb 1.6 oz (86.2 kg)    Physical Exam HEENT: No masses or thrush. Chest bilateral clear to auscultation. CVS S1-S2 regular rate and rhythm. Lymphadenopathy: No palpable supraclavicular axillary or inguinal adenopathy. Abdomen: No hepatosplenomegaly or other masses. Extremities: No edema or cyanosis.  LABORATORY DATA:  I have reviewed the labs as listed.  CBC    Component Value Date/Time   WBC 12.3 (H) 08/23/2017 1317   RBC 4.96 08/23/2017 1317   HGB 15.2 08/23/2017 1317   HCT 44.6 08/23/2017 1317   PLT 320 08/23/2017 1317   MCV 89.9  08/23/2017 1317   MCH 30.6 08/23/2017 1317   MCHC 34.1 08/23/2017 1317   RDW 13.3 08/23/2017 1317   LYMPHSABS 3.7 08/23/2017 1317   MONOABS 1.0 08/23/2017 1317   EOSABS 0.3 08/23/2017 1317   BASOSABS 0.0 08/23/2017 1317   CMP Latest Ref Rng & Units 08/23/2017 05/01/2017 04/24/2017  Glucose 70 - 99 mg/dL 160(H) 226(H) 249(H)  BUN 8 - 23 mg/dL 18 27(H) 18  Creatinine  0.61 - 1.24 mg/dL 0.96 0.83 0.86  Sodium 135 - 145 mmol/L 137 135 133(L)  Potassium 3.5 - 5.1 mmol/L 4.2 4.4 4.5  Chloride 98 - 111 mmol/L 104 98(L) 99(L)  CO2 22 - 32 mmol/L 26 23 24   Calcium 8.9 - 10.3 mg/dL 9.3 10.2 9.4  Total Protein 6.5 - 8.1 g/dL - - -  Total Bilirubin 0.3 - 1.2 mg/dL - - -  Alkaline Phos 38 - 126 U/L - - -  AST 15 - 41 U/L - - -  ALT 17 - 63 U/L - - -       DIAGNOSTIC IMAGING:  I have independently reviewed images of his CT scan dated 05/22/2017 and agree with radiology report.    ASSESSMENT & PLAN:   Leukocytosis 1.  Neutrophilic leukocytosis: - BCR/ABL done on 04/09/2017 negative. -CT scan of the abdomen and pelvis on 01/23/2017 reviewed by me showed normal spleen. -Today his white count is 12.3, decreased from his usual slightly higher counts.  His white count has been ranging anywhere between 12.3 and 17,000.  It is predominantly neutrophils.  This was thought to be secondary to his tobacco abuse. -We will see him back in 6 months for follow-up.  We will repeat his blood work at that time.  I will also do Jak 2 V6 71F testing.  2.  Smoking history: -He is current active smoker, smoked 1 pack/day since age 76.  We discussed the results of the low-dose CT scan dated 05/22/2017 which was Lung-RADS Category 2.  A 67-month follow-up CT was recommended.      Orders placed this encounter:  Orders Placed This Encounter  Procedures  . CBC with Differential/Platelet  . Comprehensive metabolic panel  . JAK2 genotypr      Derek Jack, MD [place] [phone]

## 2017-08-23 NOTE — Patient Instructions (Signed)
Garrett Clements  08/23/2017     @PREFPERIOPPHARMACY @   Your procedure is scheduled on   09/02/2017  Report to [facility] at  34   A.M.  Call this number if you have problems the morning of surgery:  [phone]   Remember:  Do not eat or drink after midnight.  You may drink clear liquids until ( follow the instructions given to you) .  Clear liquids allowed are:                    Water, Juice (non-citric and without pulp), Carbonated beverages, Clear Tea, Black Coffee only, Plain Jell-O only, Gatorade and Plain Popsicles only    Take these medicines the morning of surgery with A SIP OF WATER klonopin, pepcid, gabapentin, lisinopril, metoprolol, zoloft.    Do not wear jewelry, make-up or nail polish.  Do not wear lotions, powders, or perfumes, or deodorant.  Do not shave 48 hours prior to surgery.  Men may shave face and neck.  Do not bring valuables to the hospital.  [facility] is not responsible for any belongings or valuables.  Contacts, dentures or bridgework may not be worn into surgery.  Leave your suitcase in the car.  After surgery it may be brought to your room.  For patients admitted to the hospital, discharge time will be determined by your treatment team.  Patients discharged the day of surgery will not be allowed to drive home.   Name and phone number of your driver:   family Special instructions:  Follow the diet and prep instructions given to you by Dr Roseanne Kaufman office.  Please read over the following fact sheets that you were given. Anesthesia Post-op Instructions and Care and Recovery After Surgery       Colonoscopy, Adult A colonoscopy is an exam to look at the large intestine. It is done to check for problems, such as:  Lumps (tumors).  Growths (polyps).  Swelling (inflammation).  Bleeding.  What happens before the procedure? Eating and drinking Follow instructions from your doctor about eating and drinking. These instructions  may include:  A few days before the procedure - follow a low-fiber diet. ? Avoid nuts. ? Avoid seeds. ? Avoid dried fruit. ? Avoid raw fruits. ? Avoid vegetables.  1-3 days before the procedure - follow a clear liquid diet. Avoid liquids that have red or purple dye. Drink only clear liquids, such as: ? Clear broth or bouillon. ? Black coffee or tea. ? Clear juice. ? Clear soft drinks or sports drinks. ? Gelatin dessert. ? Popsicles.  On the day of the procedure - do not eat or drink anything during the 2 hours before the procedure.  Bowel prep If you were prescribed an oral bowel prep:  Take it as told by your doctor. Starting the day before your procedure, you will need to drink a lot of liquid. The liquid will cause you to poop (have bowel movements) until your poop is almost clear or light green.  If your skin or butt gets irritated from diarrhea, you may: ? Wipe the area with wipes that have medicine in them, such as adult wet wipes with aloe and vitamin E. ? Put something on your skin that soothes the area, such as petroleum jelly.  If you throw up (vomit) while drinking the bowel prep, take a break for up to 60 minutes. Then begin the bowel prep again. If you  keep throwing up and you cannot take the bowel prep without throwing up, call your doctor.  General instructions  Ask your doctor about changing or stopping your normal medicines. This is important if you take diabetes medicines or blood thinners.  Plan to have someone take you home from the hospital or clinic. What happens during the procedure?  An IV tube may be put into one of your veins.  You will be given medicine to help you relax (sedative).  To reduce your risk of infection: ? Your doctors will wash their hands. ? Your anal area will be washed with soap.  You will be asked to lie on your side with your knees bent.  Your doctor will get a long, thin, flexible tube ready. The tube will have a camera and  a light on the end.  The tube will be put into your anus.  The tube will be gently put into your large intestine.  Air will be delivered into your large intestine to keep it open. You may feel some pressure or cramping.  The camera will be used to take photos.  A small tissue sample may be removed from your body to be looked at under a microscope (biopsy). If any possible problems are found, the tissue will be sent to a lab for testing.  If small growths are found, your doctor may remove them and have them checked for cancer.  The tube that was put into your anus will be slowly removed. The procedure may vary among doctors and hospitals. What happens after the procedure?  Your doctor will check on you often until the medicines you were given have worn off.  Do not drive for 24 hours after the procedure.  You may have a small amount of blood in your poop.  You may pass gas.  You may have mild cramps or bloating in your belly (abdomen).  It is up to you to get the results of your procedure. Ask your doctor, or the department performing the procedure, when your results will be ready. This information is not intended to replace advice given to you by your health care provider. Make sure you discuss any questions you have with your health care provider. Document Released: 03/17/2010 Document Revised: 12/14/2015 Document Reviewed: 04/26/2015 Elsevier Interactive Patient Education  2017 [employer]  Colonoscopy, Adult, Care After This sheet gives you information about how to care for yourself after your procedure. Your health care provider may also give you more specific instructions. If you have problems or questions, contact your health care provider. What can I expect after the procedure? After the procedure, it is common to have:  A small amount of blood in your stool for 24 hours after the procedure.  Some gas.  Mild abdominal cramping or bloating.  Follow these  instructions at home: General instructions   For the first 24 hours after the procedure: ? Do not drive or use machinery. ? Do not sign important documents. ? Do not drink alcohol. ? Do your regular daily activities at a slower pace than normal. ? Eat soft, easy-to-digest foods. ? Rest often.  Take over-the-counter or prescription medicines only as told by your health care provider.  It is up to you to get the results of your procedure. Ask your health care provider, or the department performing the procedure, when your results will be ready. Relieving cramping and bloating  Try walking around when you have cramps or feel bloated.  Apply heat to  your abdomen as told by your health care provider. Use a heat source that your health care provider recommends, such as a moist heat pack or a heating pad. ? Place a towel between your skin and the heat source. ? Leave the heat on for 20-30 minutes. ? Remove the heat if your skin turns bright red. This is especially important if you are unable to feel pain, heat, or cold. You may have a greater risk of getting burned. Eating and drinking  Drink enough fluid to keep your urine clear or pale yellow.  Resume your normal diet as instructed by your health care provider. Avoid heavy or fried foods that are hard to digest.  Avoid drinking alcohol for as long as instructed by your health care provider. Contact a health care provider if:  You have blood in your stool 2-3 days after the procedure. Get help right away if:  You have more than a small spotting of blood in your stool.  You pass large blood clots in your stool.  Your abdomen is swollen.  You have nausea or vomiting.  You have a fever.  You have increasing abdominal pain that is not relieved with medicine. This information is not intended to replace advice given to you by your health care provider. Make sure you discuss any questions you have with your health care  provider. Document Released: 09/27/2003 Document Revised: 11/07/2015 Document Reviewed: 04/26/2015 Elsevier Interactive Patient Education  2018 [place] Anesthesia is a term that refers to techniques, procedures, and medicines that help a person stay safe and comfortable during a medical procedure. Monitored anesthesia care, or sedation, is one type of anesthesia. Your anesthesia specialist may recommend sedation if you will be having a procedure that does not require you to be unconscious, such as:  Cataract surgery.  A dental procedure.  A biopsy.  A colonoscopy.  During the procedure, you may receive a medicine to help you relax (sedative). There are three levels of sedation:  Mild sedation. At this level, you may feel awake and relaxed. You will be able to follow directions.  Moderate sedation. At this level, you will be sleepy. You may not remember the procedure.  Deep sedation. At this level, you will be asleep. You will not remember the procedure.  The more medicine you are given, the deeper your level of sedation will be. Depending on how you respond to the procedure, the anesthesia specialist may change your level of sedation or the type of anesthesia to fit your needs. An anesthesia specialist will monitor you closely during the procedure. Let your health care provider know about:  Any allergies you have.  All medicines you are taking, including vitamins, herbs, eye drops, creams, and over-the-counter medicines.  Any use of steroids (by mouth or as a cream).  Any problems you or family members have had with sedatives and anesthetic medicines.  Any blood disorders you have.  Any surgeries you have had.  Any medical conditions you have, such as sleep apnea.  Whether you are pregnant or may be pregnant.  Any use of cigarettes, alcohol, or street drugs. What are the risks? Generally, this is a safe procedure. However, problems may  occur, including:  Getting too much medicine (oversedation).  Nausea.  Allergic reaction to medicines.  Trouble breathing. If this happens, a breathing tube may be used to help with breathing. It will be removed when you are awake and breathing on your own.  Heart trouble.  Lung trouble.  Before the procedure Staying hydrated Follow instructions from your health care provider about hydration, which may include:  Up to 2 hours before the procedure - you may continue to drink clear liquids, such as water, clear fruit juice, black coffee, and plain tea.  Eating and drinking restrictions Follow instructions from your health care provider about eating and drinking, which may include:  8 hours before the procedure - stop eating heavy meals or foods such as meat, fried foods, or fatty foods.  6 hours before the procedure - stop eating light meals or foods, such as toast or cereal.  6 hours before the procedure - stop drinking milk or drinks that contain milk.  2 hours before the procedure - stop drinking clear liquids.  Medicines Ask your health care provider about:  Changing or stopping your regular medicines. This is especially important if you are taking diabetes medicines or blood thinners.  Taking medicines such as aspirin and ibuprofen. These medicines can thin your blood. Do not take these medicines before your procedure if your health care provider instructs you not to.  Tests and exams  You will have a physical exam.  You may have blood tests done to show: ? How well your kidneys and liver are working. ? How well your blood can clot.  General instructions  Plan to have someone take you home from the hospital or clinic.  If you will be going home right after the procedure, plan to have someone with you for 24 hours.  What happens during the procedure?  Your blood pressure, heart rate, breathing, level of pain and overall condition will be monitored.  An IV  tube will be inserted into one of your veins.  Your anesthesia specialist will give you medicines as needed to keep you comfortable during the procedure. This may mean changing the level of sedation.  The procedure will be performed. After the procedure  Your blood pressure, heart rate, breathing rate, and blood oxygen level will be monitored until the medicines you were given have worn off.  Do not drive for 24 hours if you received a sedative.  You may: ? Feel sleepy, clumsy, or nauseous. ? Feel forgetful about what happened after the procedure. ? Have a sore throat if you had a breathing tube during the procedure. ? Vomit. This information is not intended to replace advice given to you by your health care provider. Make sure you discuss any questions you have with your health care provider. Document Released: 11/08/2004 Document Revised: 07/22/2015 Document Reviewed: 06/05/2015 Elsevier Interactive Patient Education  2018 [place], Care After These instructions provide you with information about caring for yourself after your procedure. Your health care provider may also give you more specific instructions. Your treatment has been planned according to current medical practices, but problems sometimes occur. Call your health care provider if you have any problems or questions after your procedure. What can I expect after the procedure? After your procedure, it is common to:  Feel sleepy for several hours.  Feel clumsy and have poor balance for several hours.  Feel forgetful about what happened after the procedure.  Have poor judgment for several hours.  Feel nauseous or vomit.  Have a sore throat if you had a breathing tube during the procedure.  Follow these instructions at home: For at least 24 hours after the procedure:   Do not: ? Participate in activities in which you could fall or become injured. ?  Drive. ? Use heavy  machinery. ? Drink alcohol. ? Take sleeping pills or medicines that cause drowsiness. ? Make important decisions or sign legal documents. ? Take care of children on your own.  Rest. Eating and drinking  Follow the diet that is recommended by your health care provider.  If you vomit, drink water, juice, or soup when you can drink without vomiting.  Make sure you have little or no nausea before eating solid foods. General instructions  Have a responsible adult stay with you until you are awake and alert.  Take over-the-counter and prescription medicines only as told by your health care provider.  If you smoke, do not smoke without supervision.  Keep all follow-up visits as told by your health care provider. This is important. Contact a health care provider if:  You keep feeling nauseous or you keep vomiting.  You feel light-headed.  You develop a rash.  You have a fever. Get help right away if:  You have trouble breathing. This information is not intended to replace advice given to you by your health care provider. Make sure you discuss any questions you have with your health care provider. Document Released: 06/05/2015 Document Revised: 10/05/2015 Document Reviewed: 06/05/2015 Elsevier Interactive Patient Education  [employer].

## 2017-08-23 NOTE — Assessment & Plan Note (Signed)
1.  Neutrophilic leukocytosis: - BCR/ABL done on 04/09/2017 negative. -CT scan of the abdomen and pelvis on 01/23/2017 reviewed by me showed normal spleen. -Today his white count is 12.3, decreased from his usual slightly higher counts.  His white count has been ranging anywhere between 12.3 and 17,000.  It is predominantly neutrophils.  This was thought to be secondary to his tobacco abuse. -We will see him back in 6 months for follow-up.  We will repeat his blood work at that time.  I will also do Jak 2 V6 50F testing.  2.  Smoking history: -He is current active smoker, smoked 1 pack/day since age 40.  We discussed the results of the low-dose CT scan dated 05/22/2017 which was Lung-RADS Category 2.  A 40-month follow-up CT was recommended.

## 2017-08-26 ENCOUNTER — Telehealth: Payer: Self-pay | Admitting: *Deleted

## 2017-08-26 NOTE — Telephone Encounter (Signed)
[facility] and spoke with Truman Hayward clinical intake nurse. Ref #[id number]. Was advised no PA is required for TCS.

## 2017-08-27 ENCOUNTER — Other Ambulatory Visit: Payer: Self-pay

## 2017-08-27 ENCOUNTER — Encounter (HOSPITAL_COMMUNITY): Payer: Self-pay

## 2017-08-27 ENCOUNTER — Encounter (HOSPITAL_COMMUNITY)
Admission: RE | Admit: 2017-08-27 | Discharge: 2017-08-27 | Disposition: A | Discharge status: Home / Self Care | Payer: Medicare HMO | Source: Ambulatory Visit | Attending: Internal Medicine | Admitting: Internal Medicine

## 2017-08-27 DIAGNOSIS — Z01812 Encounter for preprocedural laboratory examination: Secondary | ICD-10-CM | POA: Insufficient documentation

## 2017-08-27 HISTORY — DX: Polyneuropathy, unspecified: G62.9

## 2017-08-27 HISTORY — DX: Sleep apnea, unspecified: G47.30

## 2017-08-27 LAB — BASIC METABOLIC PANEL
Anion gap: 8 (ref 5–15)
BUN: 22 mg/dL (ref 8–23)
CHLORIDE: 98 mmol/L (ref 98–111)
CO2: 29 mmol/L (ref 22–32)
CREATININE: 0.89 mg/dL (ref 0.61–1.24)
Calcium: 10 mg/dL (ref 8.9–10.3)
Glucose, Bld: 214 mg/dL — ABNORMAL HIGH (ref 70–99)
POTASSIUM: 4.6 mmol/L (ref 3.5–5.1)
SODIUM: 135 mmol/L (ref 135–145)

## 2017-08-27 LAB — CBC WITH DIFFERENTIAL/PLATELET
BASOS PCT: 0 %
Basophils Absolute: 0 10*3/uL (ref 0.0–0.1)
Eosinophils Absolute: 0.3 10*3/uL (ref 0.0–0.7)
Eosinophils Relative: 3 %
HEMATOCRIT: 45.3 % (ref 39.0–52.0)
HEMOGLOBIN: 15.2 g/dL (ref 13.0–17.0)
LYMPHS ABS: 3.3 10*3/uL (ref 0.7–4.0)
Lymphocytes Relative: 26 %
MCH: 29.7 pg (ref 26.0–34.0)
MCHC: 33.6 g/dL (ref 30.0–36.0)
MCV: 88.6 fL (ref 78.0–100.0)
MONOS PCT: 8 %
Monocytes Absolute: 1 10*3/uL (ref 0.1–1.0)
NEUTROS ABS: 8.2 10*3/uL — AB (ref 1.7–7.7)
NEUTROS PCT: 63 %
Platelets: 314 10*3/uL (ref 150–400)
RBC: 5.11 MIL/uL (ref 4.22–5.81)
RDW: 13.6 % (ref 11.5–15.5)
WBC: 12.9 10*3/uL — AB (ref 4.0–10.5)

## 2017-09-02 ENCOUNTER — Encounter (HOSPITAL_COMMUNITY)
Admission: RE | Disposition: A | Discharge status: Home / Self Care | Payer: Self-pay | Source: Ambulatory Visit | Attending: Internal Medicine

## 2017-09-02 ENCOUNTER — Other Ambulatory Visit: Payer: Self-pay

## 2017-09-02 ENCOUNTER — Ambulatory Visit: Payer: Medicare HMO | Admitting: Physician Assistant

## 2017-09-02 ENCOUNTER — Ambulatory Visit (HOSPITAL_COMMUNITY): Payer: Medicare HMO | Admitting: Anesthesiology

## 2017-09-02 ENCOUNTER — Encounter (HOSPITAL_COMMUNITY): Payer: Self-pay | Admitting: *Deleted

## 2017-09-02 ENCOUNTER — Ambulatory Visit (HOSPITAL_COMMUNITY)
Admission: RE | Admit: 2017-09-02 | Discharge: 2017-09-02 | Disposition: A | Discharge status: Home / Self Care | Payer: Medicare HMO | Source: Ambulatory Visit | Attending: Internal Medicine | Admitting: Internal Medicine

## 2017-09-02 DIAGNOSIS — Z7901 Long term (current) use of anticoagulants: Secondary | ICD-10-CM | POA: Diagnosis not present

## 2017-09-02 DIAGNOSIS — Z79899 Other long term (current) drug therapy: Secondary | ICD-10-CM | POA: Diagnosis not present

## 2017-09-02 DIAGNOSIS — Z1211 Encounter for screening for malignant neoplasm of colon: Secondary | ICD-10-CM | POA: Diagnosis not present

## 2017-09-02 DIAGNOSIS — F419 Anxiety disorder, unspecified: Secondary | ICD-10-CM | POA: Diagnosis not present

## 2017-09-02 DIAGNOSIS — G4730 Sleep apnea, unspecified: Secondary | ICD-10-CM | POA: Insufficient documentation

## 2017-09-02 DIAGNOSIS — D123 Benign neoplasm of transverse colon: Secondary | ICD-10-CM | POA: Insufficient documentation

## 2017-09-02 DIAGNOSIS — F17210 Nicotine dependence, cigarettes, uncomplicated: Secondary | ICD-10-CM | POA: Diagnosis not present

## 2017-09-02 DIAGNOSIS — K219 Gastro-esophageal reflux disease without esophagitis: Secondary | ICD-10-CM | POA: Diagnosis not present

## 2017-09-02 DIAGNOSIS — F329 Major depressive disorder, single episode, unspecified: Secondary | ICD-10-CM | POA: Insufficient documentation

## 2017-09-02 DIAGNOSIS — Z86010 Personal history of colonic polyps: Secondary | ICD-10-CM | POA: Diagnosis not present

## 2017-09-02 DIAGNOSIS — I4891 Unspecified atrial fibrillation: Secondary | ICD-10-CM | POA: Insufficient documentation

## 2017-09-02 DIAGNOSIS — I10 Essential (primary) hypertension: Secondary | ICD-10-CM | POA: Diagnosis not present

## 2017-09-02 DIAGNOSIS — E1140 Type 2 diabetes mellitus with diabetic neuropathy, unspecified: Secondary | ICD-10-CM | POA: Insufficient documentation

## 2017-09-02 DIAGNOSIS — Z7984 Long term (current) use of oral hypoglycemic drugs: Secondary | ICD-10-CM | POA: Insufficient documentation

## 2017-09-02 HISTORY — PX: COLONOSCOPY WITH PROPOFOL: SHX5780

## 2017-09-02 HISTORY — PX: POLYPECTOMY: SHX5525

## 2017-09-02 LAB — GLUCOSE, CAPILLARY
GLUCOSE-CAPILLARY: 135 mg/dL — AB (ref 70–99)
Glucose-Capillary: 163 mg/dL — ABNORMAL HIGH (ref 70–99)

## 2017-09-02 SURGERY — COLONOSCOPY WITH PROPOFOL
Anesthesia: Monitor Anesthesia Care

## 2017-09-02 MED ORDER — LACTATED RINGERS IV SOLN: INTRAVENOUS | Status: DC

## 2017-09-02 MED ORDER — MEPERIDINE HCL 100 MG/ML IJ SOLN: 6.2500 mg | INTRAMUSCULAR | Status: DC | PRN

## 2017-09-02 MED ORDER — PROPOFOL 10 MG/ML IV BOLUS
INTRAVENOUS | Status: DC | PRN
  Administered 2017-09-02 (×3): 50 mg via INTRAVENOUS

## 2017-09-02 MED ORDER — CHLORHEXIDINE GLUCONATE CLOTH 2 % EX PADS: 6.0000 | MEDICATED_PAD | Freq: Once | CUTANEOUS | Status: DC

## 2017-09-02 MED ORDER — PROMETHAZINE HCL 25 MG/ML IJ SOLN: 6.2500 mg | INTRAMUSCULAR | Status: DC | PRN

## 2017-09-02 MED ORDER — HYDROMORPHONE HCL 1 MG/ML IJ SOLN: 0.2500 mg | INTRAMUSCULAR | Status: DC | PRN

## 2017-09-02 MED ORDER — HYDROCODONE-ACETAMINOPHEN 7.5-325 MG PO TABS: 1.0000 | ORAL_TABLET | Freq: Once | ORAL | Status: DC | PRN

## 2017-09-02 MED ORDER — LACTATED RINGERS IV SOLN
INTRAVENOUS | Status: DC
  Administered 2017-09-02: 08:00:00 via INTRAVENOUS

## 2017-09-02 MED ORDER — LIDOCAINE HCL (PF) 1 % IJ SOLN
INTRAMUSCULAR | Status: DC | PRN
  Administered 2017-09-02: 20 mg

## 2017-09-02 MED ORDER — PROPOFOL 10 MG/ML IV BOLUS
INTRAVENOUS | Status: AC
  Filled 2017-09-02: qty 20

## 2017-09-02 NOTE — Transfer of Care (Signed)
Immediate Anesthesia Transfer of Care Note  Patient: Garrett Clements  Procedure(s) Performed: COLONOSCOPY WITH PROPOFOL (N/A ) POLYPECTOMY  Patient Location: PACU  Anesthesia Type:MAC  Level of Consciousness: awake, oriented, drowsy and patient cooperative  Airway & Oxygen Therapy: Patient Spontanous Breathing  Post-op Assessment: Report given to RN and Post -op Vital signs reviewed and stable  Post vital signs: Reviewed and stable  Last Vitals:  Vitals Value Taken Time  BP 131/101 09/02/2017  8:54 AM  Temp    Pulse 76 09/02/2017  8:55 AM  Resp 18 09/02/2017  8:55 AM  SpO2 97 % 09/02/2017  8:55 AM  Vitals shown include unvalidated device data.  Last Pain:  Vitals:   09/02/17 0833  TempSrc:   PainSc: 5       Patients Stated Pain Goal: 8 (12/45/80 9983)  Complications: No apparent anesthesia complications

## 2017-09-02 NOTE — Anesthesia Preprocedure Evaluation (Addendum)
Anesthesia Evaluation  Patient identified by MRN, date of birth, ID band Patient awake    Reviewed: Allergy & Precautions, H&P , NPO status , Patient's Chart, lab work & pertinent test results, reviewed documented beta blocker date and time   Airway Mallampati: II  TM Distance: >3 FB Neck ROM: full    Dental no notable dental hx. (+) Poor Dentition, Chipped, Missing, Dental Advidsory Given   Pulmonary neg pulmonary ROS, sleep apnea , Current Smoker,    Pulmonary exam normal breath sounds clear to auscultation       Cardiovascular Exercise Tolerance: Good hypertension, On Medications negative cardio ROS   Rhythm:regular Rate:Normal     Neuro/Psych  Headaches, PSYCHIATRIC DISORDERS Anxiety Depression  Neuromuscular disease negative neurological ROS  negative psych ROS   GI/Hepatic negative GI ROS, Neg liver ROS, hiatal hernia, GERD  ,  Endo/Other  negative endocrine ROSdiabetes, Type 2  Renal/GU negative Renal ROS  negative genitourinary   Musculoskeletal   Abdominal   Peds  Hematology negative hematology ROS (+)   Anesthesia Other Findings Disability secondary back injury with persistent CLBP Smokes 1.5-plus ppd  Reproductive/Obstetrics negative OB ROS                             Anesthesia Physical Anesthesia Plan  ASA: III  Anesthesia Plan: MAC   Post-op Pain Management:    Induction:   PONV Risk Score and Plan:   Airway Management Planned:   Additional Equipment:   Intra-op Plan:   Post-operative Plan:   Informed Consent: I have reviewed the patients History and Physical, chart, labs and discussed the procedure including the risks, benefits and alternatives for the proposed anesthesia with the patient or authorized representative who has indicated his/her understanding and acceptance.   Dental Advisory Given  Plan Discussed with: CRNA and Anesthesiologist  Anesthesia  Plan Comments:        Anesthesia Quick Evaluation

## 2017-09-02 NOTE — Discharge Instructions (Signed)
Colonoscopy Discharge Instructions  Read the instructions outlined below and refer to this sheet in the next few weeks. These discharge instructions provide you with general information on caring for yourself after you leave the hospital. Your doctor may also give you specific instructions. While your treatment has been planned according to the most current medical practices available, unavoidable complications occasionally occur. If you have any problems or questions after discharge, call Dr. Gala Romney at [phone]. ACTIVITY  You may resume your regular activity, but move at a slower pace for the next 24 hours.   Take frequent rest periods for the next 24 hours.   Walking will help get rid of the air and reduce the bloated feeling in your belly (abdomen).   No driving for 24 hours (because of the medicine (anesthesia) used during the test).    Do not sign any important legal documents or operate any machinery for 24 hours (because of the anesthesia used during the test).  NUTRITION  Drink plenty of fluids.   You may resume your normal diet as instructed by your doctor.   Begin with a light meal and progress to your normal diet. Heavy or fried foods are harder to digest and may make you feel sick to your stomach (nauseated).   Avoid alcoholic beverages for 24 hours or as instructed.  MEDICATIONS  You may resume your normal medications unless your doctor tells you otherwise.  WHAT YOU CAN EXPECT TODAY  Some feelings of bloating in the abdomen.   Passage of more gas than usual.   Spotting of blood in your stool or on the toilet paper.  IF YOU HAD POLYPS REMOVED DURING THE COLONOSCOPY:  No aspirin products for 7 days or as instructed.   No alcohol for 7 days or as instructed.   Eat a soft diet for the next 24 hours.  FINDING OUT THE RESULTS OF YOUR TEST Not all test results are available during your visit. If your test results are not back during the visit, make an appointment  with your caregiver to find out the results. Do not assume everything is normal if you have not heard from your caregiver or the medical facility. It is important for you to follow up on all of your test results.  SEEK IMMEDIATE MEDICAL ATTENTION IF:  You have more than a spotting of blood in your stool.   Your belly is swollen (abdominal distention).   You are nauseated or vomiting.   You have a temperature over 101.   You have abdominal pain or discomfort that is severe or gets worse throughout the day.    Colon polyp information provided  Further recommendations to follow pending review of pathology report  Resume his Xarelto today  PATIENT INSTRUCTIONS POST-ANESTHESIA  IMMEDIATELY FOLLOWING SURGERY:  Do not drive or operate machinery for the first twenty four hours after surgery.  Do not make any important decisions for twenty four hours after surgery or while taking narcotic pain medications or sedatives.  If you develop intractable nausea and vomiting or a severe headache please notify your doctor immediately.  FOLLOW-UP:  Please make an appointment with your surgeon as instructed. You do not need to follow up with anesthesia unless specifically instructed to do so.  WOUND CARE INSTRUCTIONS (if applicable):  Keep a dry clean dressing on the anesthesia/puncture wound site if there is drainage.  Once the wound has quit draining you may leave it open to air.  Generally you should leave the bandage intact  for twenty four hours unless there is drainage.  If the epidural site drains for more than 36-48 hours please call the anesthesia department.  QUESTIONS?:  Please feel free to call your physician or the hospital operator if you have any questions, and they will be happy to assist you.       Colon Polyps Polyps are tissue growths inside the body. Polyps can grow in many places, including the large intestine (colon). A polyp may be a round bump or a mushroom-shaped growth. You  could have one polyp or several. Most colon polyps are noncancerous (benign). However, some colon polyps can become cancerous over time. What are the causes? The exact cause of colon polyps is not known. What increases the risk? This condition is more likely to develop in people who:  Have a family history of colon cancer or colon polyps.  Are older than 96 or older than 45 if they are African American.  Have inflammatory bowel disease, such as ulcerative colitis or Crohn disease.  Are overweight.  Smoke cigarettes.  Do not get enough exercise.  Drink too much alcohol.  Eat a diet that is: ? High in fat and red meat. ? Low in fiber.  Had childhood cancer that was treated with abdominal radiation.  What are the signs or symptoms? Most polyps do not cause symptoms. If you have symptoms, they may include:  Blood coming from your rectum when having a bowel movement.  Blood in your stool.The stool may look dark red or black.  A change in bowel habits, such as constipation or diarrhea.  How is this diagnosed? This condition is diagnosed with a colonoscopy. This is a procedure that uses a lighted, flexible scope to look at the inside of your colon. How is this treated? Treatment for this condition involves removing any polyps that are found. Those polyps will then be tested for cancer. If cancer is found, your health care provider will talk to you about options for colon cancer treatment. Follow these instructions at home: Diet  Eat plenty of fiber, such as fruits, vegetables, and whole grains.  Eat foods that are high in calcium and vitamin D, such as milk, cheese, yogurt, eggs, liver, fish, and broccoli.  Limit foods high in fat, red meats, and processed meats, such as hot dogs, sausage, bacon, and lunch meats.  Maintain a healthy weight, or lose weight if recommended by your health care provider. General instructions  Do not smoke cigarettes.  Do not drink alcohol  excessively.  Keep all follow-up visits as told by your health care provider. This is important. This includes keeping regularly scheduled colonoscopies. Talk to your health care provider about when you need a colonoscopy.  Exercise every day or as told by your health care provider. Contact a health care provider if:  You have new or worsening bleeding during a bowel movement.  You have new or increased blood in your stool.  You have a change in bowel habits.  You unexpectedly lose weight. This information is not intended to replace advice given to you by your health care provider. Make sure you discuss any questions you have with your health care provider. Document Released: 11/09/2003 Document Revised: 07/21/2015 Document Reviewed: 01/03/2015 Elsevier Interactive Patient Education  [employer].

## 2017-09-02 NOTE — H&P (Signed)
@LOGO @   Primary Care Physician:  Redmond School, MD Primary Gastroenterologist:  Dr. Gala Romney  Pre-Procedure History & Physical: HPI:  Garrett Clements is a 62 y.o. male here for overdue surveillance colonoscopy. History of tubulovillous adenoma removed previously.  Xarelto held 2 days.  Past Medical History:  Diagnosis Date  . Anxiety   . Atrial fibrillation [facility])    Diagnosed February 2016  . Chronic back pain   . Depression   . Dysrhythmia    AFib  . Essential hypertension   . GERD (gastroesophageal reflux disease)   . H. pylori infection 07/2011  . Hiatal hernia   . Memory deficits   . Migraines   . Neuropathy   . Sleep apnea    CPAP prescibed but "I don't use"  . Tubulovillous adenoma of colon 05/23/10  . Type 2 diabetes mellitus ([place])     Past Surgical History:  Procedure Laterality Date  . CHOLECYSTECTOMY  2006  . COLONOSCOPY  05/15/10   Allix Blomquist-friable anal canal, 1cm pedunculated TVA  mid-sigmoid , diminutive adjacent polyp ablated  . ESOPHAGOGASTRODUODENOSCOPY  07/20/2011   Dr. Gala Romney- normal esophagus-s/p maloney dialation, small hiatal hernia, hpylori +  . UMBILICAL HERNIA REPAIR  2006    Prior to Admission medications   Medication Sig Start Date End Date Taking? Authorizing Provider  atorvastatin (LIPITOR) 20 MG tablet Take 20 mg by mouth every morning.   Yes [provider]  clonazePAM (KLONOPIN) 0.5 MG tablet Take 0.5 mg by mouth daily as needed for anxiety.   Yes [provider]  famotidine (PEPCID) 40 MG tablet Take 40 mg by mouth daily.   Yes [provider]  fluticasone (FLONASE) 50 MCG/ACT nasal spray Place 1 spray into both nostrils daily.   Yes [provider]  gabapentin (NEURONTIN) 300 MG capsule Take 300 mg by mouth 3 (three) times daily.   Yes [provider]  glipiZIDE (GLUCOTROL) 10 MG tablet Take 10 mg by mouth every morning. 04/13/14  Yes [provider]  lisinopril (PRINIVIL,ZESTRIL) 5 MG  tablet Take 5 mg by mouth every morning.   Yes [provider]  loperamide (IMODIUM A-D) 2 MG tablet Take 1 tablet (2 mg total) by mouth 4 (four) times daily as needed for diarrhea or loose stools. 01/23/17  Yes Fredia Sorrow, MD  metFORMIN (GLUCOPHAGE) 500 MG tablet TAKE 2 TABLETS BY MOUTH TWICE DAILY FOR 90 DAYS 03/25/17  Yes [provider]  metoprolol succinate (TOPROL XL) 25 MG 24 hr tablet Take 1 tablet (25 mg total) by mouth daily. 05/03/17  Yes Satira Sark, MD  sertraline (ZOLOFT) 50 MG tablet Take 50 mg by mouth daily.  07/29/17  Yes [provider]  XARELTO 20 MG TABS tablet TAKE 1 TABLET DAILY WITH SUPPER. 08/23/17  Yes Satira Sark, MD    Allergies as of 07/08/2017  . (No Known Allergies)    Family History  Problem Relation Age of Onset  . Diabetes Mother   . Osteoporosis Father   . Ulcers Father   . Prostate cancer Father   . Diabetes Brother   . Diabetes Brother   . Cancer Maternal Grandfather   . Colon cancer Neg Hx     Social History   Socioeconomic History  . Marital status: Divorced    Spouse name: Not on file  . Number of children: 2  . Years of education: Not on file  . Highest education level: Not on file  Occupational History  .  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 Financial resource strain: Not on file  . Food insecurity:    Worry: Not on file    Inability: Not on file  . Transportation needs:    Medical: Not on file    Non-medical: Not on file  Tobacco Use  . Smoking status: Current Every Day Smoker    Packs/day: 2.00    Years: 45.00    Pack years: 90.00    Types: Cigarettes    Start date: 05/19/1968  . Smokeless tobacco: Never Used  Substance and Sexual Activity  . Alcohol use: No    Alcohol/week: 0.0 oz    Frequency: Never    Comment: quit; used to be heavy drinker but quit 15 years  . Drug use: No  . Sexual activity: Not Currently    Birth control/protection: None   Lifestyle  . Physical activity:    Days per week: Not on file    Minutes per session: Not on file  . Stress: Not on file  Relationships  . Social connections:    Talks on phone: Not on file    Gets together: Not on file    Attends religious service: Not on file    Active member of club or organization: Not on file    Attends meetings of clubs or organizations: Not on file    Relationship status: Not on file  . Intimate partner violence:    Fear of current or ex partner: Not on file    Emotionally abused: Not on file    Physically abused: Not on file    Forced sexual activity: Not on file  Other Topics Concern  . Not on file  Social History Narrative   Lives w/ mother    Review of Systems: See HPI, otherwise negative ROS  Physical Exam: There were no vitals taken for this visit. General:   Alert,  Well-developed, well-nourished, pleasant and cooperative in NAD Neck:  Supple; no masses or thyromegaly. No significant cervical adenopathy. Lungs:  Clear throughout to auscultation.   No wheezes, crackles, or rhonchi. No acute distress. Heart:  Regular rate and rhythm; no murmurs, clicks, rubs,  or gallops. Abdomen: Non-distended, normal bowel sounds.  Soft and nontender without appreciable mass or hepatosplenomegaly.  Pulses:  Normal pulses noted. Extremities:  Without clubbing or edema.  Impression/Plan:   62 year old gentleman history of tubulovillous adenoma; overdue for surveillance colonoscopy. I will offer the patient is colonoscopy.  The risks, benefits, limitations, alternatives and imponderables have been reviewed with the patient. Questions have been answered. All parties are agreeable.      Notice: This dictation was prepared with Dragon dictation along with smaller phrase technology. Any transcriptional errors that result from this process are unintentional and may not be corrected upon review.

## 2017-09-02 NOTE — Addendum Note (Signed)
Addendum  created 09/02/17 1242 by Ollen Bowl, CRNA   SmartForm saved

## 2017-09-02 NOTE — Anesthesia Postprocedure Evaluation (Addendum)
Anesthesia Post Note  Patient: Garrett Clements  Procedure(s) Performed: COLONOSCOPY WITH PROPOFOL (N/A ) POLYPECTOMY  Anesthesia Type:  MAC   Last Vitals:  Vitals:   09/02/17 0900 09/02/17 0915  BP: (!) 84/58 100/65  Pulse: 63 65  Resp: 16 17  Temp: 36.4 C   SpO2: 97% 99%    Last Pain:  Vitals:   09/02/17 0900  TempSrc:   PainSc: Asleep                 Bradie Sangiovanni E Cande Mastropietro

## 2017-09-02 NOTE — Op Note (Signed)
[facility] Patient Name: Garrett Clements Procedure Date: 09/02/2017 8:30 AM MRN: [id number] Date of Birth: 05-27-1955 Attending MD: Norvel Richards , MD CSN: [id number] Age: 62 Admit Type: Outpatient Procedure:                Colonoscopy Indications:              High risk colon cancer surveillance: Personal                            history of colonic polyps Providers:                Norvel Richards, MD, Otis Peak B. Sharon Seller, RN,                            Nelma Rothman, Technician Referring MD:              Medicines:                Propofol per Anesthesia Complications:            No immediate complications. Estimated Blood Loss:     Estimated blood loss was minimal. Procedure:                Pre-Anesthesia Assessment:                           - Prior to the procedure, a History and Physical                            was performed, and patient medications and                            allergies were reviewed. The patient's tolerance of                            previous anesthesia was also reviewed. The risks                            and benefits of the procedure and the sedation                            options and risks were discussed with the patient.                            All questions were answered, and informed consent                            was obtained. Prior Anticoagulants: The patient                            last took Xarelto (rivaroxaban) 2 days prior to the                            procedure. ASA Grade Assessment: II - A patient  with mild systemic disease. After reviewing the                            risks and benefits, the patient was deemed in                            satisfactory condition to undergo the procedure.                           After obtaining informed consent, the colonoscope                            was passed under direct vision. Throughout the                            procedure, the  patient's blood pressure, pulse, and                            oxygen saturations were monitored continuously. The                            [id number]-[id number] ([id number]) scope was introduced through                            the and advanced to the the cecum, identified by                            appendiceal orifice and ileocecal valve. The                            EC-[id number] ([id number]) scope was introduced through                            the and advanced to the. The colonoscopy was                            performed without difficulty. The patient tolerated                            the procedure well. The quality of the bowel                            preparation was adequate. The ileocecal valve,                            appendiceal orifice, and rectum were photographed.                            The entire colon was well visualized. Scope In: 8:38:25 AM Scope Out: 8:49:43 AM Scope Withdrawal Time: 0 hours 7 minutes 1 second  Total Procedure Duration: 0 hours 11 minutes 18 seconds  Findings:      The perianal and digital rectal examinations were normal.      A 5 mm polyp was found in  the mid transverse colon. The polyp was       sessile. The polyp was removed with a cold snare. Resection and       retrieval were complete. Estimated blood loss was minimal.      The exam was otherwise without abnormality on direct and retroflexion       views. Impression:               - One 5 mm polyp in the mid transverse colon,                            removed with a cold snare. Resected and retrieved.                           - The examination was otherwise normal on direct                            and retroflexion views. Moderate Sedation:      Moderate (conscious) sedation was personally administered by an       anesthesia professional. The following parameters were monitored: oxygen       saturation, heart rate, blood pressure, respiratory rate, EKG, adequacy       of pulmonary  ventilation, and response to care. Total physician       intraservice time was 17 minutes. Recommendation:           - Patient has a contact number available for                            emergencies. The signs and symptoms of potential                            delayed complications were discussed with the                            patient. Return to normal activities tomorrow.                            Written discharge instructions were provided to the                            patient.                           - Resume previous diet.                           - Continue present medications.                           - Repeat colonoscopy date to be determined after                            pending pathology results are reviewed for                            surveillance based on pathology results.                           -  Return to GI office (date not yet determined). Procedure Code(s):        --- Professional ---                           [phone], Colonoscopy, flexible; with removal of                            tumor(s), polyp(s), or other lesion(s) by snare                            technique Diagnosis Code(s):        --- Professional ---                           Z86.010, Personal history of colonic polyps                           D12.3, Benign neoplasm of transverse colon (hepatic                            flexure or splenic flexure) CPT copyright 2017 American Medical Association. All rights reserved. The codes documented in this report are preliminary and upon coder review may  be revised to meet current compliance requirements. Garrett Estimable. Rourk, MD Norvel Richards, MD 09/02/2017 8:57:39 AM This report has been signed electronically. Number of Addenda: 0

## 2017-09-06 ENCOUNTER — Encounter (HOSPITAL_COMMUNITY): Payer: Self-pay | Admitting: Internal Medicine

## 2017-09-06 ENCOUNTER — Encounter: Payer: Self-pay | Admitting: Internal Medicine

## 2017-09-10 ENCOUNTER — Encounter: Payer: Self-pay | Admitting: Physician Assistant

## 2017-09-10 NOTE — Progress Notes (Deleted)
Cardiology Office Note    Date:  09/10/2017  ID:  Garrett Clements, DOB 07/09/1955, MRN [id number] PCP:  Redmond School, MD  Cardiologist:  Rozann Lesches, MD  Chief Complaint: f/u atrial fib, stress test  History of Present Illness:  Garrett Clements is a 62 y.o. male with history of persistent atrial fibrillation, tobacco abuse, anxiety, depression, chronic back pain, HTN, hypertriglyceridemia, GERD, H pylori, hiatal hernia, memory deficits, migraines, DM, leukocytosis, coronary artery calcification who presents for f/u. He was found to have atrial fib for an uncertain duration on office examination with primary care. He was started on Xarelto by PCP and referred to cardiology. 2D Echo 03/2014 showed EF 55-60%, mild LVH, mild LAE, MAC with trivial MR, mildly ectatic aortic root. 24-hour Holter showed atrial fib with some rates in the 150s noted, so Toprol was started. He was seen back in follow-up 04/2014 still in atrial fib and it appears rate control strategy was pursued. He was lost to follow-up until seen back in 2018 and was off his meds. Xarelto was restarted but HR was relatively controlled. He saw Dr. Domenic Polite 05/03/2017 after he developed chest discomfort and shortness of breath with transient rash and swelling of his throat after a spinal injection. Troponins were normal and he was still in afib at that time. Toprol was re-added for rate control. He saw Ermalinda Barrios 06/2017 reporting episodic chest discomfort wihle working in the yard, sometimes associated with increased HR. Nuclear stress test 07/23/17 showed small, mild intensity, inferolateral defect that is most prominent on rest imaging from apex to base and suggestive of variable soft tissue attenuation rather than scar. No large ischemic territories noted, low risk study, 63%. Of note, low dose lung CA screening CT 04/2016 showed emphysema, coronary artery calcification and aortic calcification. Last labs 08/2017 Cr 0.89, K 4.6, persistent  leukocytosis 12.9, Hgb 15.2. Lipids 2016 showed trig 459, HDL 33, unable to calculate LDL  no prior dccv Lipids for cor art [facility] MEMORY ISSUES   Chronic atrial fib Exertional chest discomfort Coronary artery calcification Essential HTN    Past Medical History:  Diagnosis Date  . Anxiety   . Aortic atherosclerosis ([place])   . Chronic atrial fibrillation [facility])    Diagnosed February 2016  . Chronic back pain   . Coronary artery calcification   . Depression   . Essential hypertension   . GERD (gastroesophageal reflux disease)   . H. pylori infection 07/2011  . Hiatal hernia   . Leukocytosis   . Memory deficits   . Migraines   . Neuropathy   . Sleep apnea    CPAP prescibed but "I don't use"  . Tubulovillous adenoma of colon 05/23/10  . Type 2 diabetes mellitus ([place])     Past Surgical History:  Procedure Laterality Date  . CHOLECYSTECTOMY  2006  . COLONOSCOPY  05/15/10   Rourk-friable anal canal, 1cm pedunculated TVA  mid-sigmoid , diminutive adjacent polyp ablated  . COLONOSCOPY WITH PROPOFOL N/A 09/02/2017   Procedure: COLONOSCOPY WITH PROPOFOL;  Surgeon: Daneil Dolin, MD;  [facility];  Service: Endoscopy;  Laterality: N/A;  8:30am  . ESOPHAGOGASTRODUODENOSCOPY  07/20/2011   Dr. Gala Romney- normal esophagus-s/p maloney dialation, small hiatal hernia, hpylori +  . POLYPECTOMY  09/02/2017   Procedure: POLYPECTOMY;  Surgeon: Daneil Dolin, MD;  [facility];  Service: Endoscopy;;  colon  . UMBILICAL HERNIA REPAIR  2006    Current Medications: No outpatient medications have been marked  as taking for the 09/12/17 encounter (Appointment) with Charlie Pitter, PA-C.   ***   Allergies:   Patient has no known allergies.   Social History   Socioeconomic History  . Marital status: Divorced    Spouse name: Not on file  . Number of children: 2  . Years of education: Not on file  . Highest education level: Not on file  Occupational History  . Occupation:  Disabled    Fish farm manager: NOT EMPLOYED  Social Needs  . Financial resource strain: Not on file  . Food insecurity:    Worry: Not on file    Inability: Not on file  . Transportation needs:    Medical: Not on file    Non-medical: Not on file  Tobacco Use  . Smoking status: Current Every Day Smoker    Packs/day: 2.00    Years: 45.00    Pack years: 90.00    Types: Cigarettes    Start date: 05/19/1968  . Smokeless tobacco: Never Used  Substance and Sexual Activity  . Alcohol use: No    Alcohol/week: 0.0 oz    Frequency: Never    Comment: quit; used to be heavy drinker but quit 15 years  . Drug use: No  . Sexual activity: Not Currently    Birth control/protection: None  Lifestyle  . Physical activity:    Days per week: Not on file    Minutes per session: Not on file  . Stress: Not on file  Relationships  . Social connections:    Talks on phone: Not on file    Gets together: Not on file    Attends religious service: Not on file    Active member of club or organization: Not on file    Attends meetings of clubs or organizations: Not on file    Relationship status: Not on file  Other Topics Concern  . Not on file  Social History Narrative   Lives w/ mother     Family History:  The patient's ***family history includes Cancer in his maternal grandfather; Diabetes in his brother, brother, and mother; Osteoporosis in his father; Prostate cancer in his father; Ulcers in his father. There is no history of Colon cancer.  ROS:   Please see the history of present illness. Otherwise, review of systems is positive for ***.  All other systems are reviewed and otherwise negative.    PHYSICAL EXAM:   VS:  There were no vitals taken for this visit.  BMI: There is no height or weight on file to calculate BMI. GEN: Well nourished, well developed, in no acute distress HEENT: normocephalic, atraumatic Neck: no JVD, carotid bruits, or masses Cardiac: ***RRR; no murmurs, rubs, or gallops, no  edema  Respiratory:  clear to auscultation bilaterally, normal work of breathing GI: soft, nontender, nondistended, + BS MS: no deformity or atrophy Skin: warm and dry, no rash Neuro:  Alert and Oriented x 3, Strength and sensation are intact, follows commands Psych: euthymic mood, full affect  Wt Readings from Last 3 Encounters:  09/02/17 190 lb (86.2 kg)  08/23/17 190 lb 1.6 oz (86.2 kg)  07/15/17 188 lb (85.3 kg)      Studies/Labs Reviewed:   EKG:  EKG was ordered today and personally reviewed by me and demonstrates *** EKG was not ordered today.***  Recent Labs: 04/09/2017: ALT 17 08/27/2017: BUN 22; Creatinine, Ser 0.89; Hemoglobin 15.2; Platelets 314; Potassium 4.6; Sodium 135   Lipid Panel No results found for: CHOL, TRIG,  HDL, CHOLHDL, VLDL, LDLCALC, LDLDIRECT  Additional studies/ records that were reviewed today include: Summarized above.***    ASSESSMENT & PLAN:   1. ***  Disposition: F/u with ***   Medication Adjustments/Labs and Tests Ordered: Current medicines are reviewed at length with the patient today.  Concerns regarding medicines are outlined above. Medication changes, Labs and Tests ordered today are summarized above and listed in the Patient Instructions accessible in Encounters.   Signed, Charlie Pitter, PA-C  09/10/2017 4:28 PM    [facility] [place]. [place], [place] [address] Ph: [phone]; Fax [phone]

## 2017-09-12 ENCOUNTER — Ambulatory Visit: Payer: Medicare HMO | Admitting: Physician Assistant

## 2017-10-23 DIAGNOSIS — E1142 Type 2 diabetes mellitus with diabetic polyneuropathy: Secondary | ICD-10-CM | POA: Diagnosis not present

## 2017-10-23 DIAGNOSIS — I481 Persistent atrial fibrillation: Secondary | ICD-10-CM | POA: Diagnosis not present

## 2017-10-23 DIAGNOSIS — R12 Heartburn: Secondary | ICD-10-CM | POA: Insufficient documentation

## 2017-10-23 DIAGNOSIS — F3341 Major depressive disorder, recurrent, in partial remission: Secondary | ICD-10-CM | POA: Insufficient documentation

## 2017-10-23 DIAGNOSIS — D729 Disorder of white blood cells, unspecified: Secondary | ICD-10-CM | POA: Diagnosis not present

## 2017-10-23 DIAGNOSIS — E7800 Pure hypercholesterolemia, unspecified: Secondary | ICD-10-CM | POA: Insufficient documentation

## 2017-10-23 DIAGNOSIS — G8929 Other chronic pain: Secondary | ICD-10-CM | POA: Diagnosis not present

## 2017-10-23 DIAGNOSIS — Z0000 Encounter for general adult medical examination without abnormal findings: Secondary | ICD-10-CM | POA: Insufficient documentation

## 2017-10-23 DIAGNOSIS — M545 Low back pain: Secondary | ICD-10-CM | POA: Diagnosis not present

## 2017-11-06 DIAGNOSIS — E119 Type 2 diabetes mellitus without complications: Secondary | ICD-10-CM | POA: Diagnosis not present

## 2017-11-06 DIAGNOSIS — H2513 Age-related nuclear cataract, bilateral: Secondary | ICD-10-CM | POA: Diagnosis not present

## 2017-11-06 DIAGNOSIS — H524 Presbyopia: Secondary | ICD-10-CM | POA: Diagnosis not present

## 2017-11-06 DIAGNOSIS — H353131 Nonexudative age-related macular degeneration, bilateral, early dry stage: Secondary | ICD-10-CM | POA: Diagnosis not present

## 2017-12-05 DIAGNOSIS — E1142 Type 2 diabetes mellitus with diabetic polyneuropathy: Secondary | ICD-10-CM | POA: Diagnosis not present

## 2017-12-05 DIAGNOSIS — Z1159 Encounter for screening for other viral diseases: Secondary | ICD-10-CM | POA: Diagnosis not present

## 2017-12-05 DIAGNOSIS — E7800 Pure hypercholesterolemia, unspecified: Secondary | ICD-10-CM | POA: Diagnosis not present

## 2017-12-05 DIAGNOSIS — I4819 Other persistent atrial fibrillation: Secondary | ICD-10-CM | POA: Diagnosis not present

## 2017-12-12 DIAGNOSIS — J40 Bronchitis, not specified as acute or chronic: Secondary | ICD-10-CM | POA: Diagnosis not present

## 2017-12-12 DIAGNOSIS — D729 Disorder of white blood cells, unspecified: Secondary | ICD-10-CM | POA: Diagnosis not present

## 2017-12-12 DIAGNOSIS — Z23 Encounter for immunization: Secondary | ICD-10-CM | POA: Diagnosis not present

## 2017-12-12 DIAGNOSIS — R1310 Dysphagia, unspecified: Secondary | ICD-10-CM | POA: Diagnosis not present

## 2017-12-12 DIAGNOSIS — I4819 Other persistent atrial fibrillation: Secondary | ICD-10-CM | POA: Diagnosis not present

## 2017-12-12 DIAGNOSIS — E1142 Type 2 diabetes mellitus with diabetic polyneuropathy: Secondary | ICD-10-CM | POA: Diagnosis not present

## 2017-12-12 DIAGNOSIS — E7800 Pure hypercholesterolemia, unspecified: Secondary | ICD-10-CM | POA: Diagnosis not present

## 2017-12-19 DIAGNOSIS — L84 Corns and callosities: Secondary | ICD-10-CM | POA: Diagnosis not present

## 2017-12-19 DIAGNOSIS — B351 Tinea unguium: Secondary | ICD-10-CM | POA: Diagnosis not present

## 2017-12-19 DIAGNOSIS — M79675 Pain in left toe(s): Secondary | ICD-10-CM | POA: Diagnosis not present

## 2017-12-19 DIAGNOSIS — E1140 Type 2 diabetes mellitus with diabetic neuropathy, unspecified: Secondary | ICD-10-CM | POA: Diagnosis not present

## 2017-12-19 DIAGNOSIS — M79674 Pain in right toe(s): Secondary | ICD-10-CM | POA: Diagnosis not present

## 2018-01-16 DIAGNOSIS — R1310 Dysphagia, unspecified: Secondary | ICD-10-CM | POA: Diagnosis not present

## 2018-01-16 DIAGNOSIS — R12 Heartburn: Secondary | ICD-10-CM | POA: Diagnosis not present

## 2018-01-16 DIAGNOSIS — I4891 Unspecified atrial fibrillation: Secondary | ICD-10-CM | POA: Diagnosis not present

## 2018-01-16 DIAGNOSIS — Z7901 Long term (current) use of anticoagulants: Secondary | ICD-10-CM | POA: Diagnosis not present

## 2018-02-28 ENCOUNTER — Other Ambulatory Visit (HOSPITAL_COMMUNITY): Payer: Medicare HMO

## 2018-03-07 ENCOUNTER — Ambulatory Visit (HOSPITAL_COMMUNITY): Payer: Medicare HMO | Admitting: Hematology

## 2018-03-24 NOTE — Progress Notes (Deleted)
Garrett Clements was seen today in the movement disorders clinic for neurologic consultation at the request of Redmond School, MD.  The consultation is for the evaluation of "abnormal tone" and "eval for movement disorder."  The records that were made available to me were reviewed but no records available about abnormal tone/movement abnormalities.  Pts daughter in law states that when patient was examined he was told he had low tone in the arm.  Pt c/o paresthesias in the legs - "feels like someone is sticking needles in the legs and feet."  That has been going on 5-10 years.  He has been told he is DM for many years.  That didn't used to be well controlled but it is much better.  Daughter in law states that his A1C is 9.0 or so but it was much worse.  He staggers a little with ambulation.  Told he had spinal stenosis in the back based on recent MRI of the lumbar spine and awaiting appt from neurosx.  MRI of the lumbar spine was completed on March 15, 2017.  I reviewed this.  There was evidence of spondylosis at L3/L4 with moderately severe central canal stenosis at this level.  He takes a compounded cream of lidocaine, gabapentin and it doesn't help.  It is very expensive.  He tried gabapentin in the past, but it caused nausea and he discontinued the medication  Admits to occasional headache.  03/26/18 update: Patient is seen today in follow-up for tremor and neuropathy.  Has not seen him in about a year.  Last year, I thought perhaps he had some carpal tunnel in addition to neuropathy.  He had an EMG done on April 16, 2017.  There was evidence of moderate carpal tunnel syndrome on the right.  There was evidence of probable chronic L5 radiculopathy on the right.  There was no evidence of a large fiber peripheral neuropathy.  Patient was offered referral to hand surgeon.  He declined.  They were given a prescription for a cock-up wrist splint.  In regards to tremor, the patient states that ***.   Records are reviewed since last visit.  Patient has been seen at the cancer center for neutrophilic leukocytosis.  This was thought to be due to his tobacco abuse.   ALLERGIES:  No Known Allergies  CURRENT MEDICATIONS:  Outpatient Encounter Medications as of 03/26/2018  Medication Sig  . atorvastatin (LIPITOR) 20 MG tablet Take 20 mg by mouth every morning.  . clonazePAM (KLONOPIN) 0.5 MG tablet Take 0.5 mg by mouth daily as needed for anxiety.  . famotidine (PEPCID) 40 MG tablet Take 40 mg by mouth daily.  . fluticasone (FLONASE) 50 MCG/ACT nasal spray Place 1 spray into both nostrils daily.  Marland Kitchen gabapentin (NEURONTIN) 300 MG capsule Take 300 mg by mouth 3 (three) times daily.  Marland Kitchen glipiZIDE (GLUCOTROL) 10 MG tablet Take 10 mg by mouth every morning.  Marland Kitchen lisinopril (PRINIVIL,ZESTRIL) 5 MG tablet Take 5 mg by mouth every morning.  . loperamide (IMODIUM A-D) 2 MG tablet Take 1 tablet (2 mg total) by mouth 4 (four) times daily as needed for diarrhea or loose stools.  . metFORMIN (GLUCOPHAGE) 500 MG tablet TAKE 2 TABLETS BY MOUTH TWICE DAILY FOR 90 DAYS  . metoprolol succinate (TOPROL XL) 25 MG 24 hr tablet Take 1 tablet (25 mg total) by mouth daily.  . sertraline (ZOLOFT) 50 MG tablet Take 50 mg by mouth daily.   Alveda Reasons 20 MG TABS tablet  TAKE 1 TABLET DAILY WITH SUPPER.   No facility-administered encounter medications on file as of 03/26/2018.     PAST MEDICAL HISTORY:   Past Medical History:  Diagnosis Date  . Anxiety   . Aortic atherosclerosis ([place])   . Chronic atrial fibrillation [facility])    Diagnosed February 2016  . Chronic back pain   . Coronary artery calcification   . Depression   . Essential hypertension   . GERD (gastroesophageal reflux disease)   . H. pylori infection 07/2011  . Hiatal hernia   . Leukocytosis   . Memory deficits   . Migraines   . Neuropathy   . Sleep apnea    CPAP prescibed but "I don't use"  . Tubulovillous adenoma of colon 05/23/10  . Type 2 diabetes  mellitus ([place])     PAST SURGICAL HISTORY:   Past Surgical History:  Procedure Laterality Date  . CHOLECYSTECTOMY  2006  . COLONOSCOPY  05/15/10   Rourk-friable anal canal, 1cm pedunculated TVA  mid-sigmoid , diminutive adjacent polyp ablated  . COLONOSCOPY WITH PROPOFOL N/A 09/02/2017   Procedure: COLONOSCOPY WITH PROPOFOL;  Surgeon: Daneil Dolin, MD;  [facility];  Service: Endoscopy;  Laterality: N/A;  8:30am  . ESOPHAGOGASTRODUODENOSCOPY  07/20/2011   Dr. Gala Romney- normal esophagus-s/p maloney dialation, small hiatal hernia, hpylori +  . POLYPECTOMY  09/02/2017   Procedure: POLYPECTOMY;  Surgeon: Daneil Dolin, MD;  [facility];  Service: Endoscopy;;  colon  . UMBILICAL HERNIA REPAIR  2006    SOCIAL HISTORY:   Social History   Socioeconomic History  . Marital status: Divorced    Spouse name: Not on file  . Number of children: 2  . Years of education: Not on file  . Highest education level: Not on file  Occupational History  . 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 Financial resource strain: Not on file  . Food insecurity:    Worry: Not on file    Inability: Not on file  . Transportation needs:    Medical: Not on file    Non-medical: Not on file  Tobacco Use  . Smoking status: Current Every Day Smoker    Packs/day: 2.00    Years: 45.00    Pack years: 90.00    Types: Cigarettes    Start date: 05/19/1968  . Smokeless tobacco: Never Used  Substance and Sexual Activity  . Alcohol use: No    Alcohol/week: 0.0 standard drinks    Frequency: Never    Comment: quit; used to be heavy drinker but quit 15 years  . Drug use: No  . Sexual activity: Not Currently    Birth control/protection: None  Lifestyle  . Physical activity:    Days per week: Not on file    Minutes per session: Not on file  . Stress: Not on file  Relationships  . Social connections:    Talks on phone: Not on file    Gets together: Not on file    Attends  religious service: Not on file    Active member of club or organization: Not on file    Attends meetings of clubs or organizations: Not on file    Relationship status: Not on file  . Intimate partner violence:    Fear of current or ex partner: Not on file    Emotionally abused: Not on file    Physically abused: Not on file    Forced sexual activity:  Not on file  Other Topics Concern  . Not on file  Social History Narrative   Lives w/ mother    FAMILY HISTORY:   Family Status  Relation Name Status  . Mother  Alive  . Father  Alive  . Brother  Alive       x3  . Sister  Alive  . Brother  (Not Specified)  . Brother  (Not Specified)  . Child 2 Alive  . MGF  (Not Specified)  . Neg Hx  (Not Specified)    ROS: Patient complains of rather diffuse pain.  It happens in the arms, back, legs.  He admits to some depression.  No suicidal or homicidal ideation.  A complete 10 system review of systems was obtained and was unremarkable apart from what is mentioned above.  PHYSICAL EXAMINATION:    VITALS:   There were no vitals filed for this visit.  GEN:  The patient appears stated age and is in NAD. HEENT:  Normocephalic, atraumatic.  The mucous membranes are moist. The superficial temporal arteries are without ropiness or tenderness. CV:  RRR Lungs:  CTAB Neck/HEME:  There are no carotid bruits bilaterally.  Neurological examination:  Orientation: The patient is alert and oriented x3. Fund of knowledge is appropriate.  Recent and remote memory are intact.  Attention and concentration are normal.    Able to name objects and repeat phrases. Cranial nerves: There is good facial symmetry. Pupils are equal round and reactive to light bilaterally. Fundoscopic exam reveals clear margins bilaterally. There is extropia on the L with strabismus (chronic as child per patient).   The visual fields are full to confrontational testing. The speech is fluent and clear. Soft palate rises symmetrically  and there is no tongue deviation. Hearing is intact to conversational tone. Sensation: Sensation is intact to light and pinprick throughout (facial, trunk, extremities). Vibration is intact at the bilateral big toe. There is no extinction with double simultaneous stimulation. There is no sensory dermatomal level identified. Motor: Strength is 5/5 in the bilateral upper extremities, with the exception of the intrinsic muscles of the hand and there is marked decrease in finger abduction.  Strength in APB bilaterally is good.  Manual motor testing in the lower extremities is somewhat limited by pain, but is at least 5-/5.  There is decreased grip strength bilaterally.   Shoulder shrug is equal and symmetric.  There is no pronator drift. Deep tendon reflexes: Deep tendon reflexes are 2/4 at the bilateral biceps, triceps, brachioradialis, 3+/4 at the bilateral patella with cross adductor reflexes present and 1/4 at the bilateral achilles. Plantar responses are downgoing bilaterally.  Movement examination: Tone: The patient has trouble relaxing when asked to and has a component of paratonia.  Once significantly distracted he is able to relax and tone becomes normal.   Abnormal movements: There is very rare right upper extremity resting tremor. Coordination:  There is no decremation with RAM's, with any form of RAMS, including alternating supination and pronation of the forearm, hand opening and closing, finger taps, heel taps and toe taps.  He does complain about pain in his hands with doing most rapid alternating movements with the hands, which limits ability to assess rapid alternating movements in the hands. Gait and Station: The patient has mild difficulty arising out of a deep-seated chair without the use of the hands, mostly attributed to back pain.  The patient's stride length is decreased, but gait is very wide-based and he holds his back  while walking.  He is mildly unsteady.    ASSESSMENT/PLAN:  1.   Peripheral neuropathy  -The patient has clinical examination evidence of a diffuse peripheral neuropathy, which certainly can affect gait and balance.  We discussed safety associated with peripheral neuropathy.  We discussed balance therapy and the importance of ambulatory assistive device for balance assistance.  His neuropathy is most certainly due to uncontrolled diabetes, which he is working to control much better.  If not already done so, I would recommend, in addition to his diabetes labs, B12,, SPEP/UPEP with immunofixation.  He can have these done at his next primary care visit.  -He is having significant difficulty opening jars, which certainly may be due to peripheral neuropathy affecting hands.  However, he may have some coexisting carpal tunnel as well.  We decided to proceed with EMG.  -He asked about options for treating neuropathy.  He has already tried gabapentin.  We discussed Lyrica and Cymbalta.  Cymbalta actually may be a good option for him given his pain and depression.  He is going to talk to his primary care physician about these options given that he really is not going to be following here regularly.  2.  Tremor  -He does have some mild tremor, but does not meet criteria for Parkinson's disease.  He and I discussed this today.  He has a significant difficulty relaxing, but once I was able to get him to do so, tone was normal.  I decided to follow him up in a year, and less new neurologic issues arise and he can follow-up here before that   3.  Tobacco abuse  -Currently smoking 2.0 packs/day    - Patient was informed of the dangers of tobacco abuse including stroke, cancer, and MI, as well as benefits of tobacco cessation.  - Patient not willing to quit at this time.  - Approximately 3 mins were spent counseling patient cessation techniques. We discussed various methods to help quit smoking, including deciding on a date to quit, joining a support group, pharmacological agents-  nicotine gum/patch/lozenges, chantix,   - I will reassess his progress at future visit.  4.  ***   Cc:  Redmond School, MD

## 2018-03-26 ENCOUNTER — Ambulatory Visit: Payer: Medicare HMO | Admitting: Neurology

## 2018-04-28 ENCOUNTER — Encounter: Payer: Self-pay | Admitting: Acute Care

## 2018-06-19 IMAGING — MR MR LUMBAR SPINE W/O CM
4 series · 16 of 48 positions shown · non-contrast
Comparison: CT abdomen and pelvis 01/23/2017.

CLINICAL DATA: Chronic low back and lower extremity pain.

EXAM:
MRI LUMBAR SPINE WITHOUT CONTRAST
TECHNIQUE: Multiplanar, multisequence MR imaging of the lumbar spine was
performed. No intravenous contrast was administered.

[Series 3: T2 · sagittal · 4.0mm · 0.71mm/px · 7 of 17 slices shown (1 of 2)]
[im 1/17]
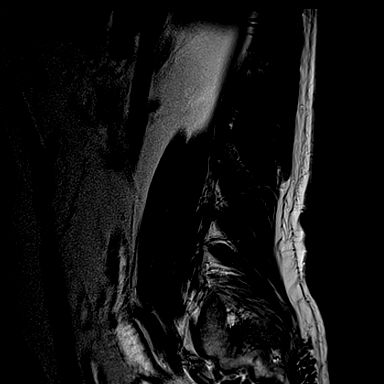
[im 3/17]
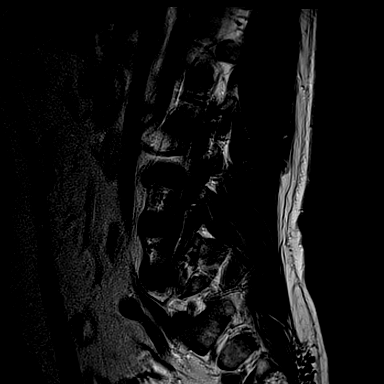
[im 5/17]
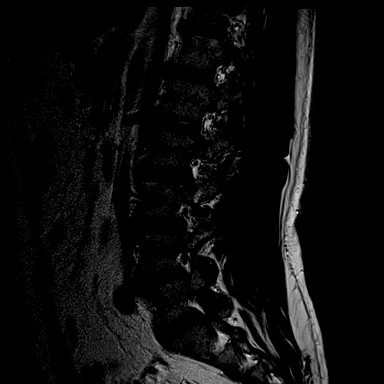
[im 7/17]
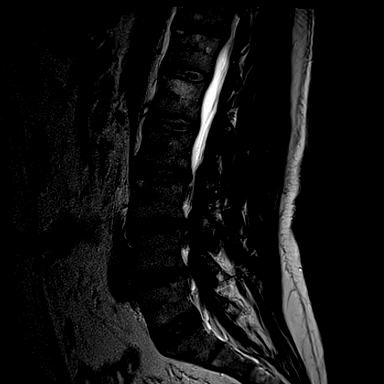
[im 10/17]
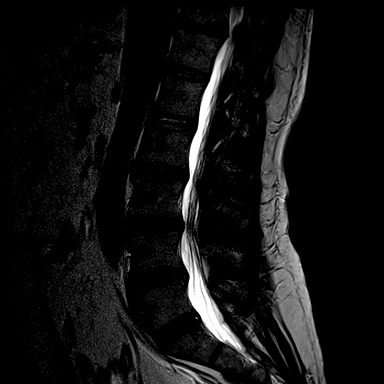
[im 12/17]
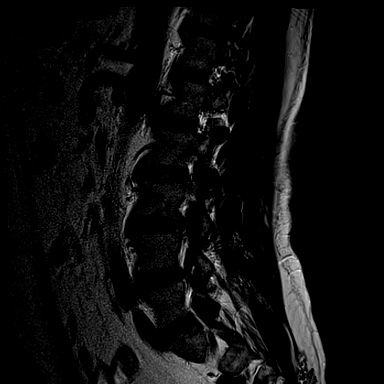
[im 14/17]
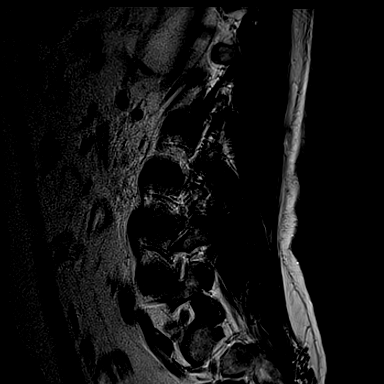

[Series 4: T1 · sagittal · 4.0mm · 0.35mm/px · 3 of 17 slices shown]
[im 3/17]
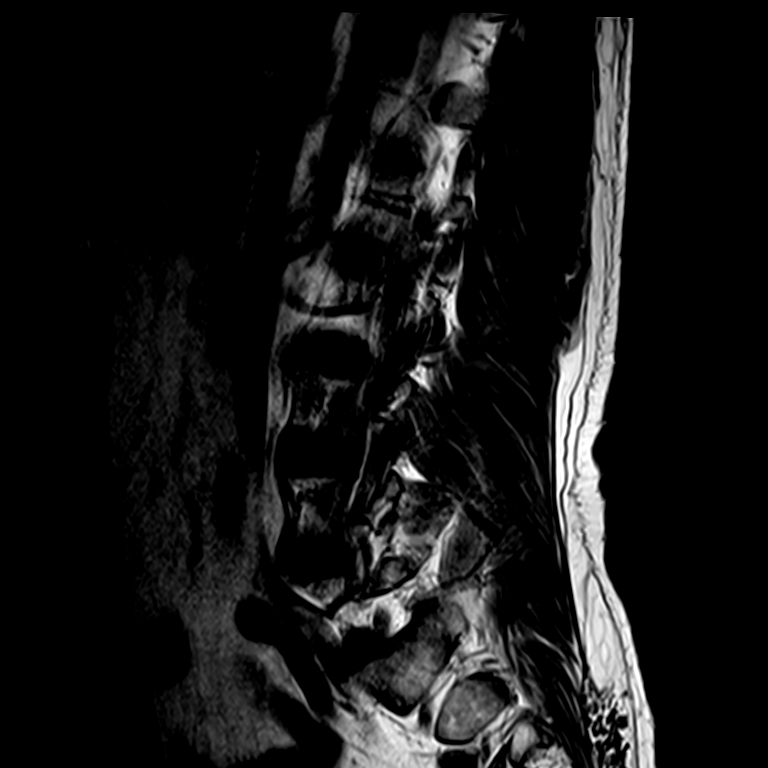
[im 9/17]
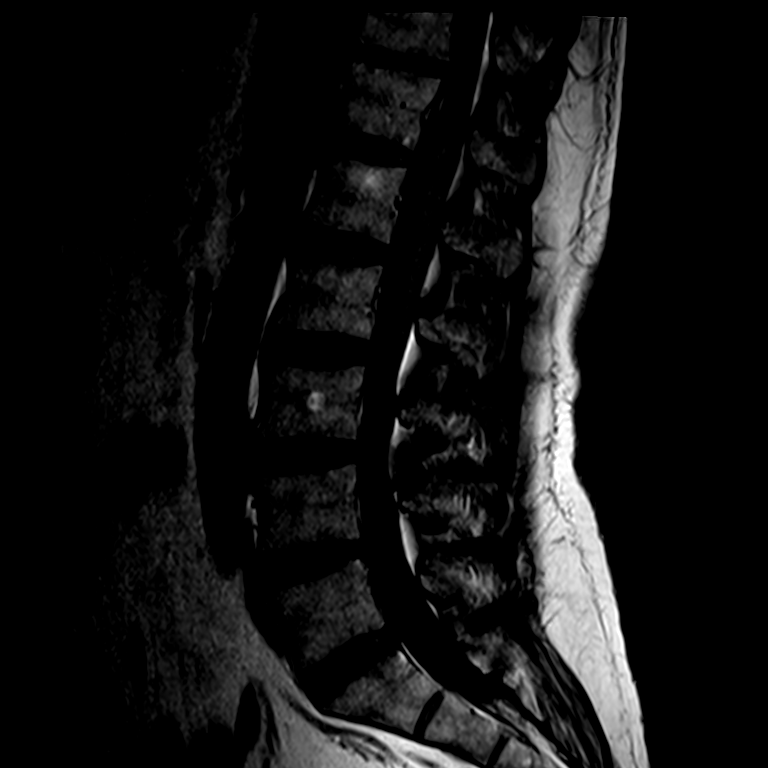
[im 15/17]
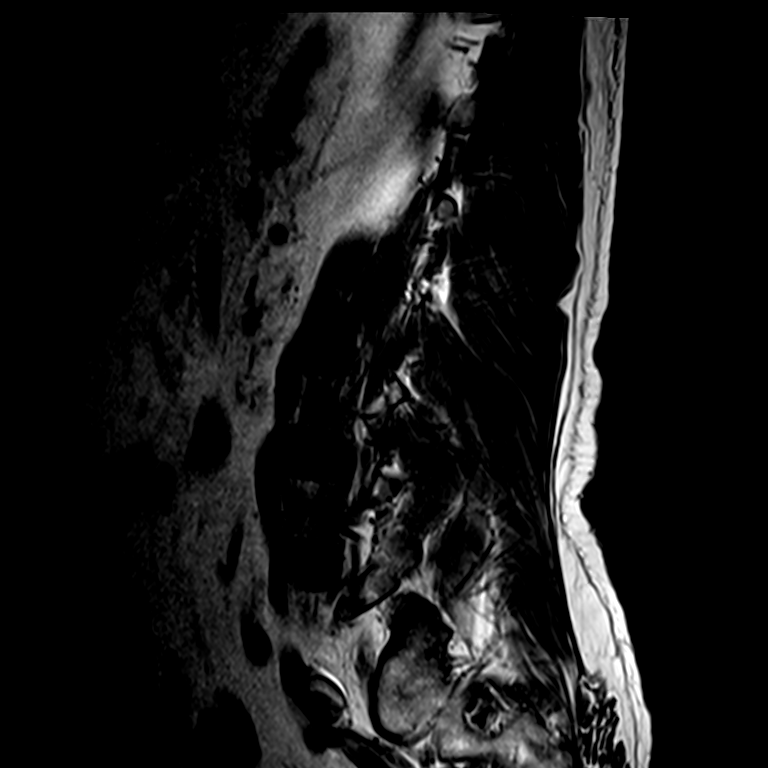

[Series 5: ir sagital · sagittal · 4.0mm · 0.43mm/px · 3 of 17 slices shown]
[im 3/17]
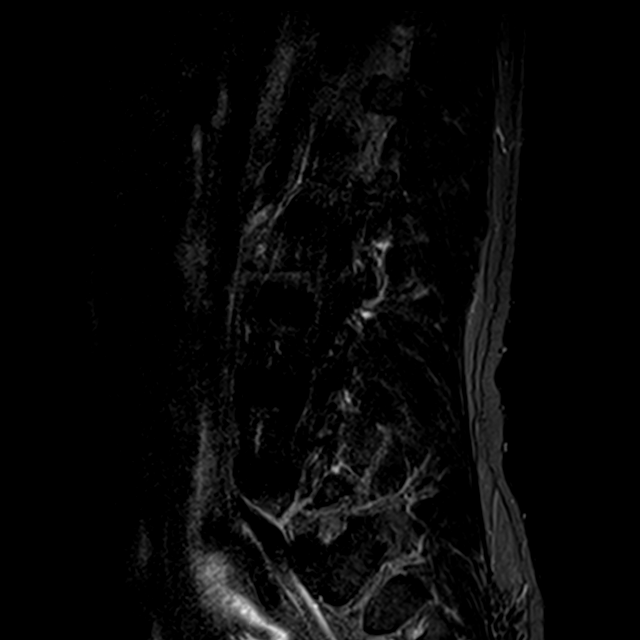
[im 9/17]
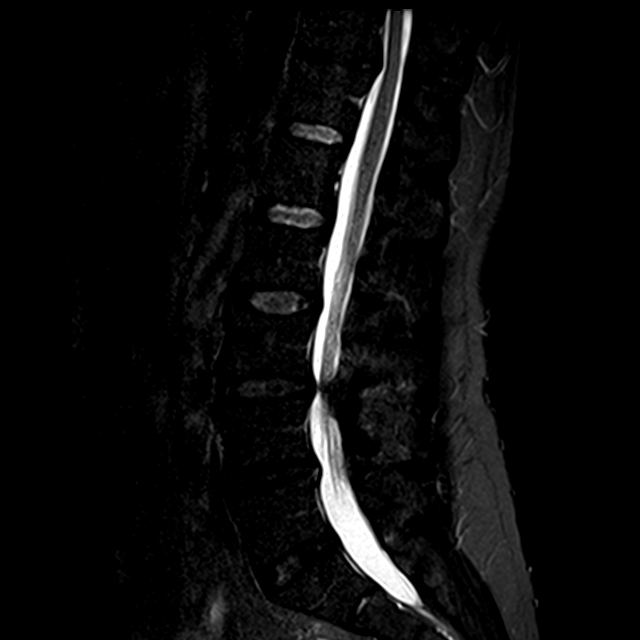
[im 15/17]
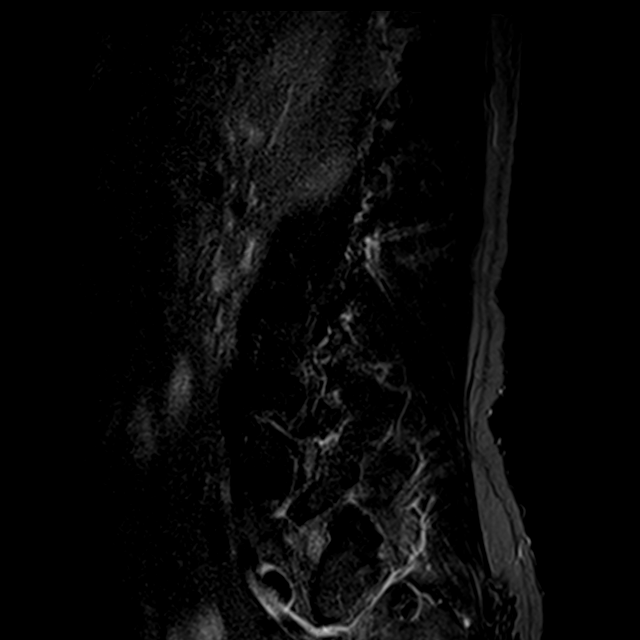

[Series 6: T2 · axial · 4.0mm · 0.25mm/px · z∈[-14,+134]mm · 3 of 43 slices shown (2 of 2)]
[im 7/43]
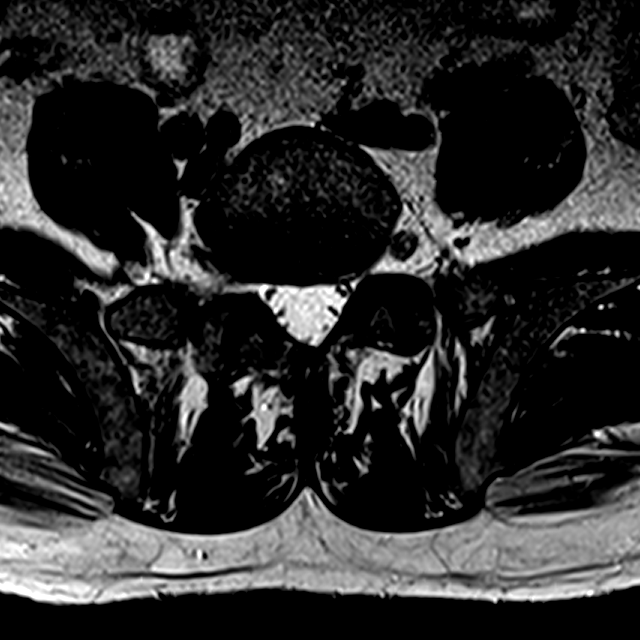
[im 23/43]
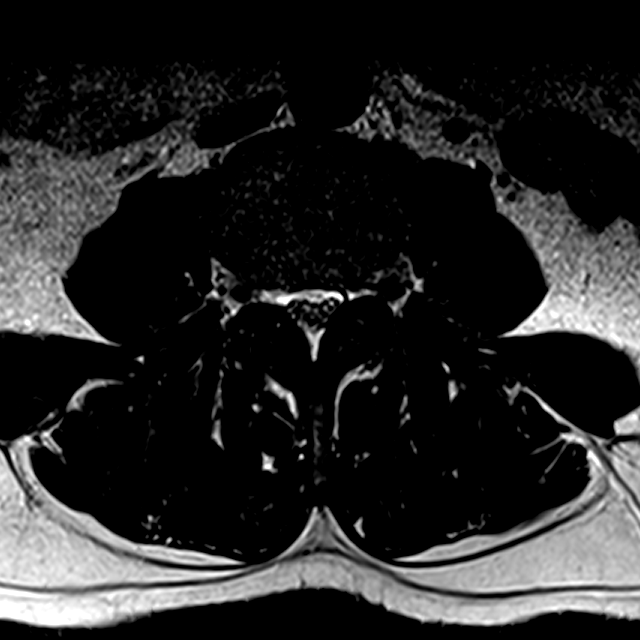
[im 37/43]
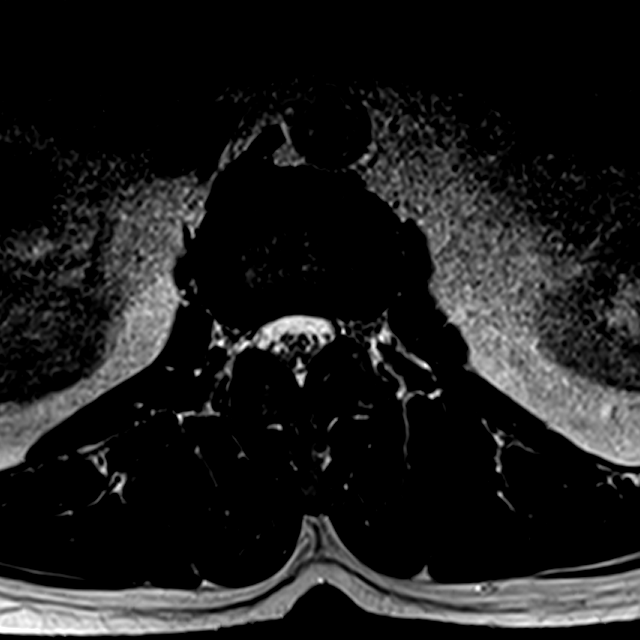

[16 of 48 positions shown; findings below may reference images not displayed]

FINDINGS: Segmentation:  Standard.

Alignment:  Maintained.

Vertebrae: No fracture or worrisome lesion. A few scattered
hemangiomas are seen, most prominent in L1.

Conus medullaris and cauda equina: Conus extends to the L1-2 level.
Conus and cauda equina appear normal.

Paraspinal and other soft tissues: Left renal cyst is unchanged.

Disc levels:

T11-12 and T12-L1 are imaged in the sagittal plane only. Shallow
disc bulge at T11-12 appears slightly deflect the distal cord
although the central canal appears open. There is some left
foraminal narrowing at T11-12. T12-L1 is negative.

L1-2: Negative.

L2-3: Shallow disc bulge and ligamentum flavum thickening cause mild
central canal narrowing. The foramina are open.

L3-4: Ligamentum flavum thickening, diffuse broad-based disc bulge
and moderate facet degenerative disease are seen. There is
moderately severe central canal stenosis. Protruding disc beyond the
left foramen contacts the exited left L3 root.

L4-5: Shallow broad-based disc bulge causes mild central canal and
bilateral subarticular recess narrowing. The foramina are open.

L5-S1: Negative.
IMPRESSION: Spondylosis worst at L3-4 where there is moderately severe central
canal stenosis. Protruding disc just beyond the left foramen at L3-4
contacts the left L3 root although the root does not appear
compressed or displaced.

Mild central canal narrowing L2-3.

Mild central canal and bilateral subarticular recess narrowing L4-5.

## 2018-08-05 IMAGING — DX DG CHEST 2V
2 series · 2 of 2 positions shown · non-contrast
Comparison: 01/23/2017, 07/18/2004

CLINICAL DATA: Chest pain

EXAM:
CHEST - 2 VIEW

[chest pa]
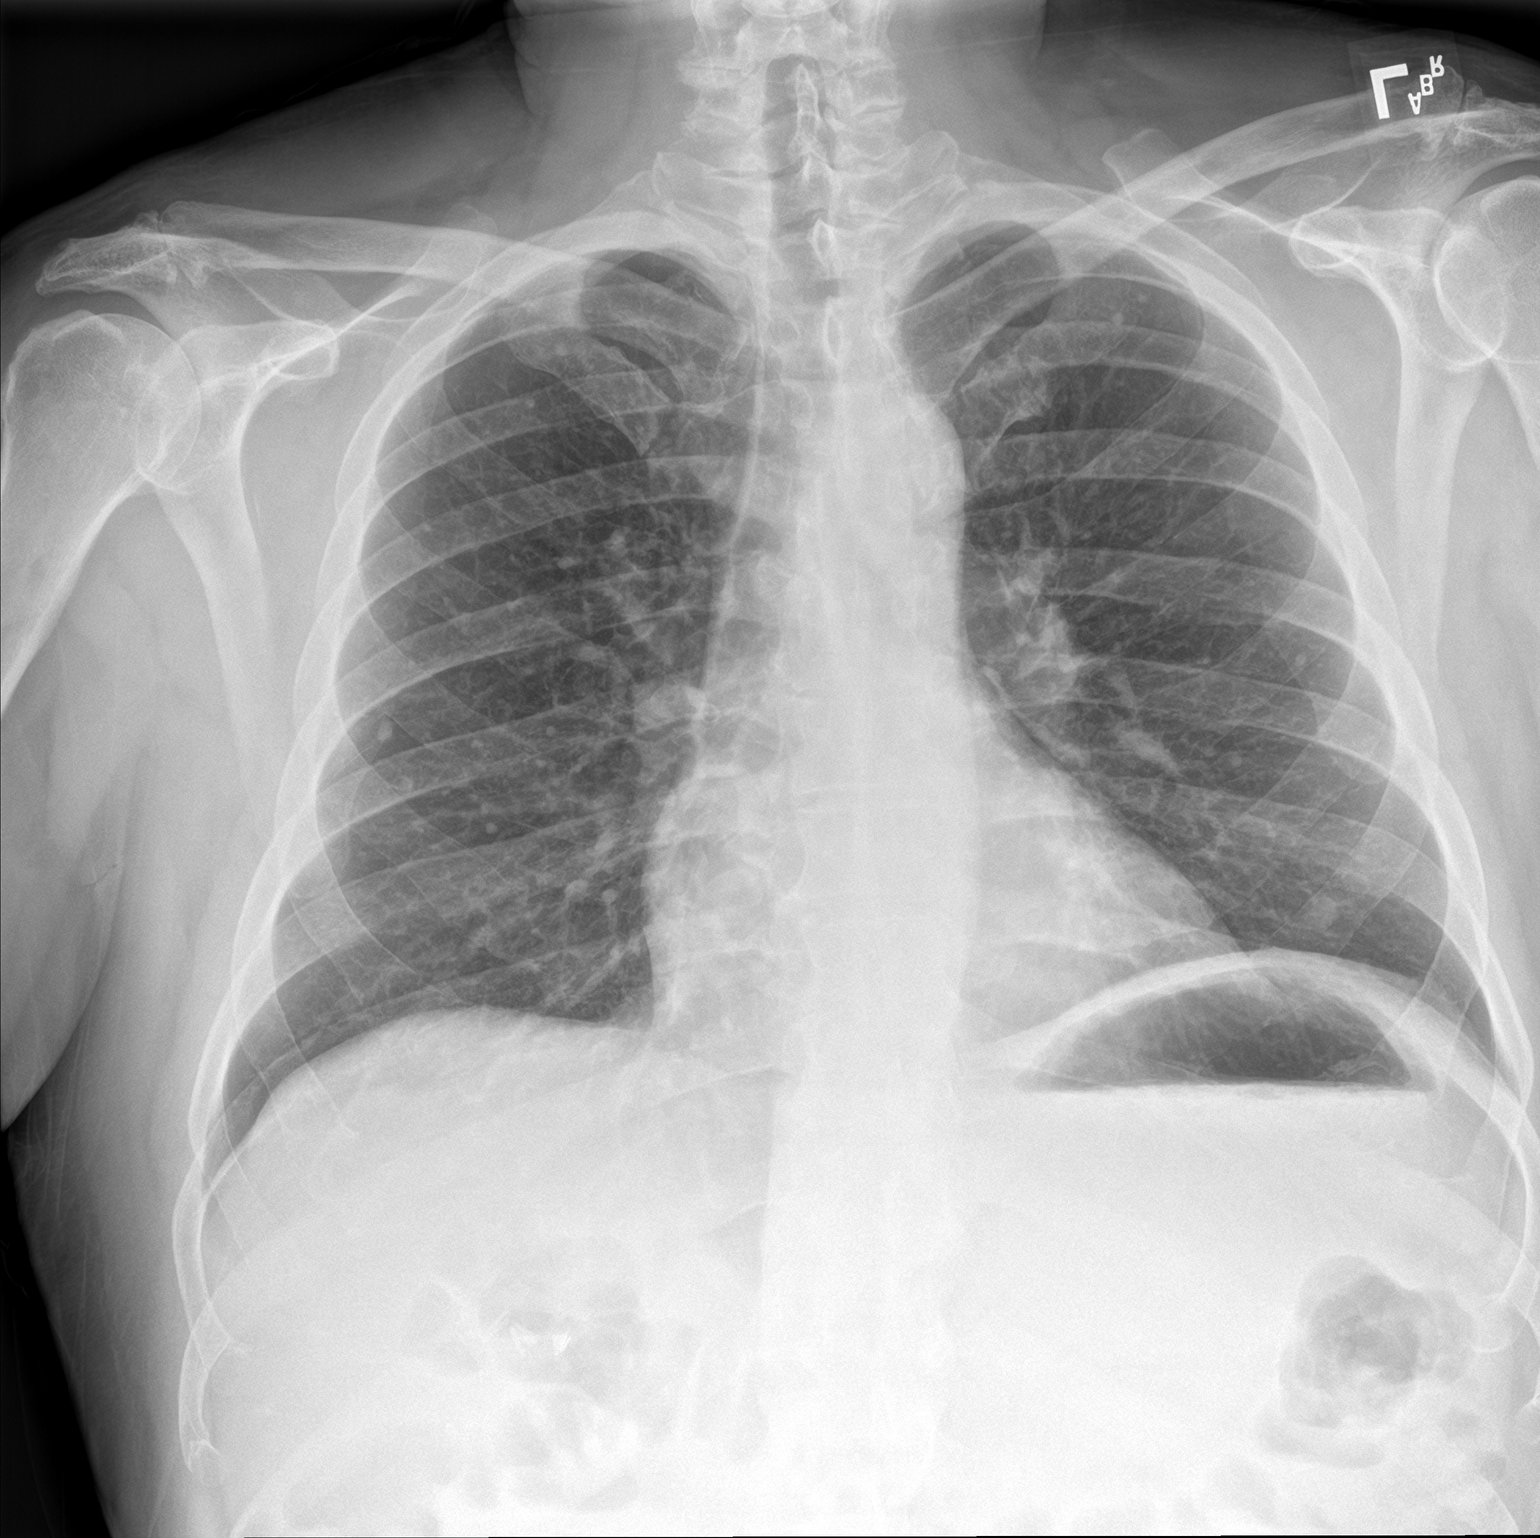

[chest lat]
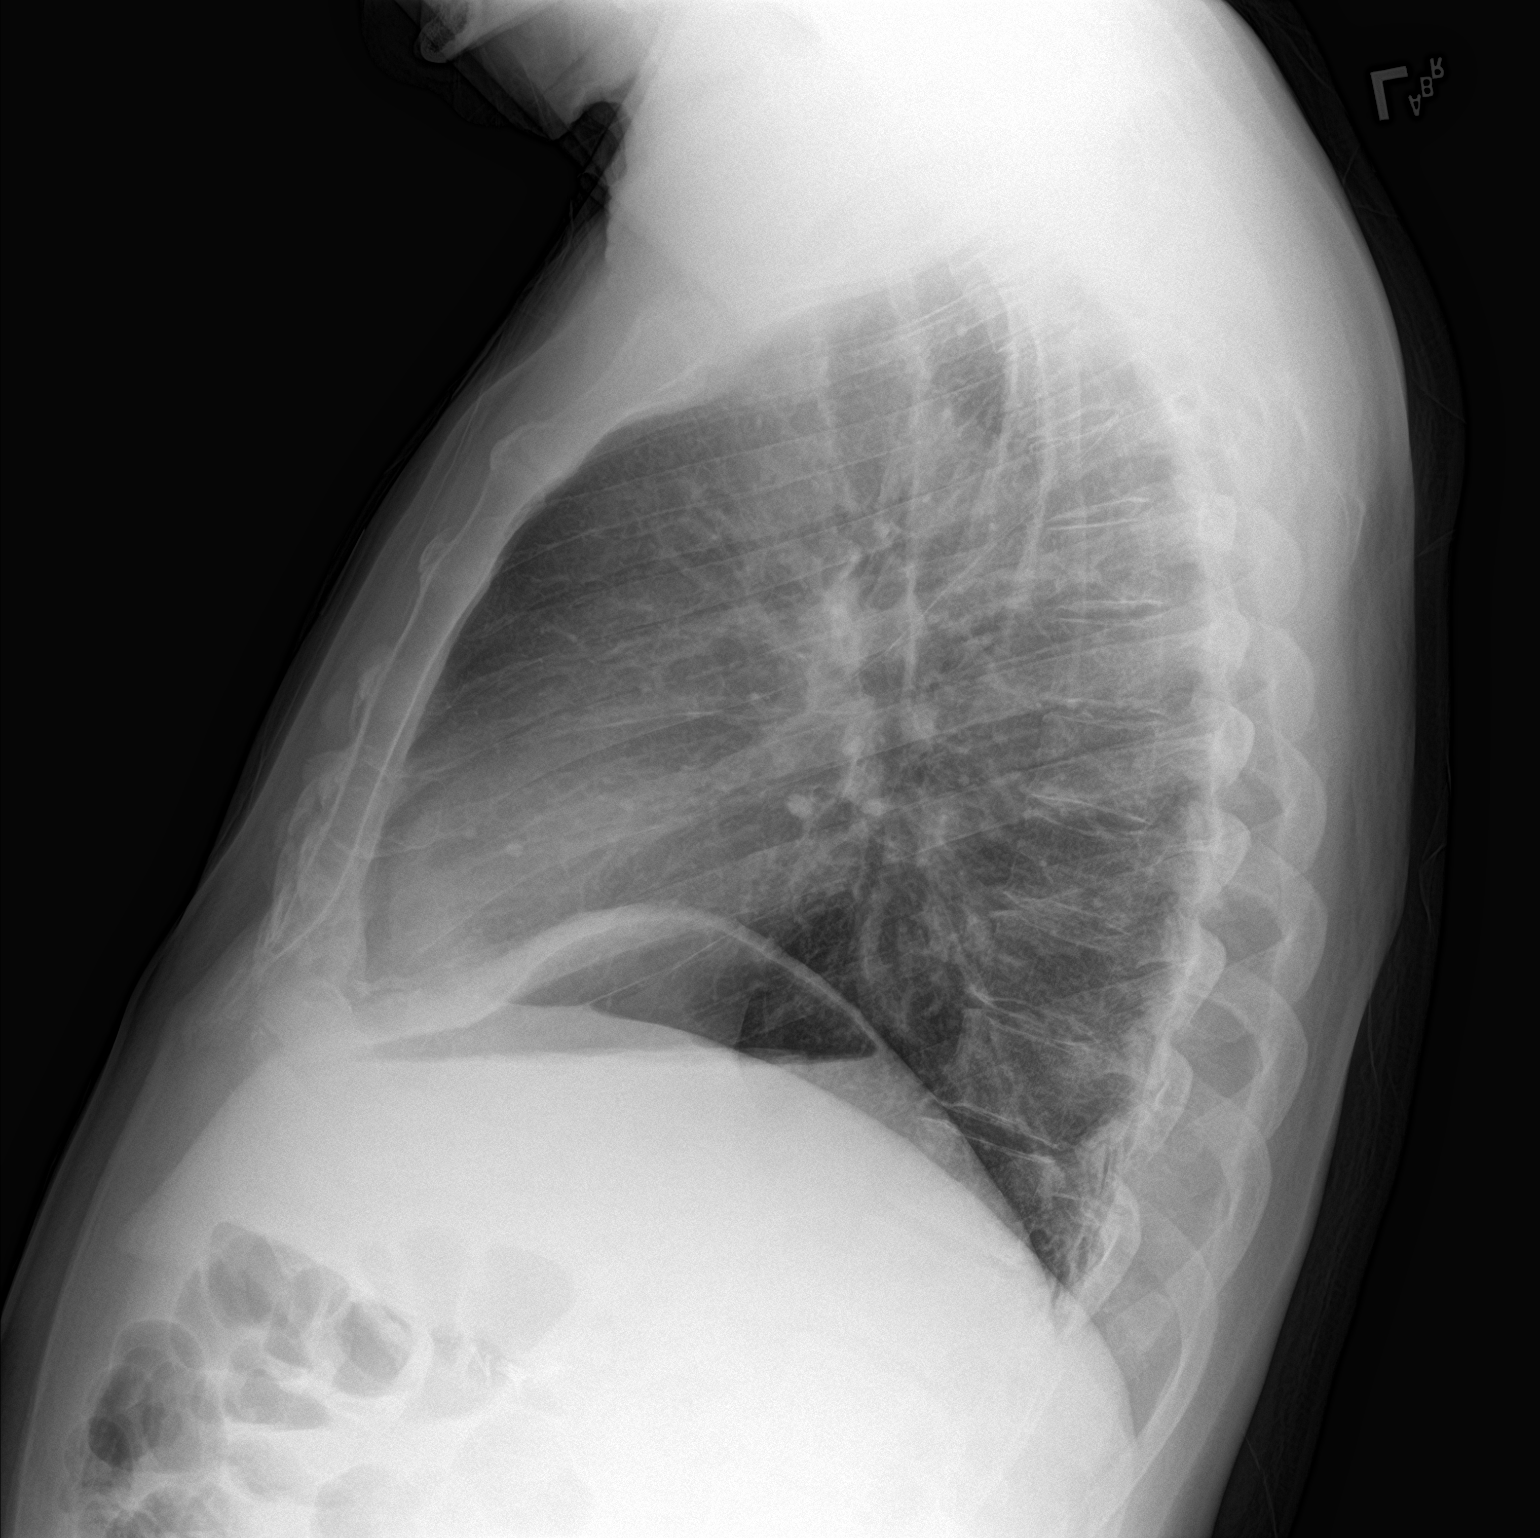

[2 of 2 positions shown; findings below may reference images not displayed]

FINDINGS: Multiple small pulmonary nodules are again noted. No acute
consolidation or effusion. Normal cardiomediastinal silhouette. A
faint nodular opacity in the left lower lung could reflect a nipple
shadow.
IMPRESSION: 1. No radiographic evidence for acute cardiopulmonary abnormality
2. Stable multiple bilateral lung nodules as compared with 6448
radiograph consistent with benign finding, likely granuloma.
3. Nodular opacity at the left lung base may reflect nipple shadow.
Short interval radiographic follow-up with nipple markers suggested.

## 2019-12-08 ENCOUNTER — Encounter (INDEPENDENT_AMBULATORY_CARE_PROVIDER_SITE_OTHER): Payer: Self-pay

## 2019-12-08 DIAGNOSIS — E1165 Type 2 diabetes mellitus with hyperglycemia: Secondary | ICD-10-CM | POA: Diagnosis not present

## 2019-12-08 DIAGNOSIS — Z1389 Encounter for screening for other disorder: Secondary | ICD-10-CM | POA: Diagnosis not present

## 2019-12-08 DIAGNOSIS — J449 Chronic obstructive pulmonary disease, unspecified: Secondary | ICD-10-CM | POA: Diagnosis not present

## 2019-12-08 DIAGNOSIS — Z1331 Encounter for screening for depression: Secondary | ICD-10-CM | POA: Diagnosis not present

## 2019-12-08 DIAGNOSIS — H3530 Unspecified macular degeneration: Secondary | ICD-10-CM | POA: Diagnosis not present

## 2019-12-08 DIAGNOSIS — E7849 Other hyperlipidemia: Secondary | ICD-10-CM | POA: Diagnosis not present

## 2019-12-08 DIAGNOSIS — I10 Essential (primary) hypertension: Secondary | ICD-10-CM | POA: Diagnosis not present

## 2019-12-08 DIAGNOSIS — Z0001 Encounter for general adult medical examination with abnormal findings: Secondary | ICD-10-CM | POA: Diagnosis not present

## 2019-12-08 DIAGNOSIS — Z6829 Body mass index (BMI) 29.0-29.9, adult: Secondary | ICD-10-CM | POA: Diagnosis not present

## 2019-12-08 DIAGNOSIS — E1140 Type 2 diabetes mellitus with diabetic neuropathy, unspecified: Secondary | ICD-10-CM | POA: Diagnosis not present

## 2020-01-04 ENCOUNTER — Other Ambulatory Visit: Payer: Self-pay

## 2020-01-04 ENCOUNTER — Ambulatory Visit: Payer: Medicare HMO | Admitting: Podiatry

## 2020-01-04 ENCOUNTER — Encounter: Payer: Self-pay | Admitting: Podiatry

## 2020-01-04 DIAGNOSIS — B351 Tinea unguium: Secondary | ICD-10-CM

## 2020-01-04 DIAGNOSIS — M79674 Pain in right toe(s): Secondary | ICD-10-CM

## 2020-01-04 DIAGNOSIS — L84 Corns and callosities: Secondary | ICD-10-CM | POA: Diagnosis not present

## 2020-01-04 DIAGNOSIS — Z7901 Long term (current) use of anticoagulants: Secondary | ICD-10-CM | POA: Diagnosis not present

## 2020-01-04 DIAGNOSIS — M79675 Pain in left toe(s): Secondary | ICD-10-CM

## 2020-01-04 DIAGNOSIS — E1142 Type 2 diabetes mellitus with diabetic polyneuropathy: Secondary | ICD-10-CM

## 2020-01-04 DIAGNOSIS — I4819 Other persistent atrial fibrillation: Secondary | ICD-10-CM | POA: Diagnosis not present

## 2020-01-04 NOTE — Progress Notes (Signed)
  Subjective:  Patient ID: Garrett Clements, male    DOB: 1955/12/12,  MRN: [id number]  Chief Complaint  Patient presents with  . Nail Problem    nail and callous trim and DFC  . Callouses  . Diabetes    64 y.o. male presents with the above complaint. History confirmed with patient.  Here today with his wife.  His A1c was very high at one point, and is now in the low sevens.  He complains of burning tingling made, he takes gabapentin maximum dose for this.  Has not completely resolved it.  Topical medications are not been helpful either.  With seeing podiatrist in [facility] for regular care and recently moved back to the area and like to transition care.  That podiatrist had suggested a CBD cream for the feet.  Objective:  Physical Exam: warm, good capillary refill, no trophic changes or ulcerative lesions, normal DP and PT pulses, abnormal monofilament exam with loss of protective sensation and onychomycosis x10, he has calluses submet 5, submet 1 and medial hallux bilaterally. Assessment:   1. Type 2 diabetes mellitus with diabetic polyneuropathy, without long-term current use of insulin (HCC)   2. Persistent atrial fibrillation ([place])   3. Long term (current) use of anticoagulants   4. Onychomycosis   5. Pain due to onychomycosis of toenails of both feet   6. Callus of foot      Plan:  Patient was evaluated and treated and all questions answered.  Patient educated on diabetes. Discussed proper diabetic foot care and discussed risks and complications of disease. Educated patient in depth on reasons to return to the office immediately should he/she discover anything concerning or new on the feet. All questions answered. Discussed proper shoes as well.   Discussed with him that it might be reasonable to try the CBD cream.  I do not have much other to offer them as gabapentin has not been helpful and topical lidocaine has not been helpful as well.  They could try to be  seen at the [facility] for a second opinion.  Discussed the etiology and treatment options for the condition in detail with the patient. Educated patient on the topical and oral treatment options for mycotic nails. Recommended debridement of the nails today. Sharp and mechanical debridement performed of all painful and mycotic nails today. Nails debrided in length and thickness using a nail nipper and a mechanical burr to level of comfort. Discussed treatment options including appropriate shoe gear. Follow up as needed for painful nails.   All symptomatic hyperkeratoses were safely debrided with a sterile #15 blade to patient's level of comfort without incident. We discussed preventative and palliative care of these lesions including supportive and accommodative shoegear, padding, prefabricated and custom molded accommodative orthoses, use of a pumice stone and lotions/creams daily.   Return in about 3 months (around 04/05/2020).

## 2020-01-12 ENCOUNTER — Encounter (INDEPENDENT_AMBULATORY_CARE_PROVIDER_SITE_OTHER): Payer: Medicare HMO | Admitting: Ophthalmology

## 2020-02-01 ENCOUNTER — Encounter (INDEPENDENT_AMBULATORY_CARE_PROVIDER_SITE_OTHER): Payer: Self-pay | Admitting: Ophthalmology

## 2020-02-01 ENCOUNTER — Other Ambulatory Visit: Payer: Self-pay

## 2020-02-01 ENCOUNTER — Ambulatory Visit (INDEPENDENT_AMBULATORY_CARE_PROVIDER_SITE_OTHER): Payer: Medicare HMO | Admitting: Ophthalmology

## 2020-02-01 DIAGNOSIS — E1142 Type 2 diabetes mellitus with diabetic polyneuropathy: Secondary | ICD-10-CM

## 2020-02-01 DIAGNOSIS — H2513 Age-related nuclear cataract, bilateral: Secondary | ICD-10-CM

## 2020-02-01 DIAGNOSIS — H353132 Nonexudative age-related macular degeneration, bilateral, intermediate dry stage: Secondary | ICD-10-CM

## 2020-02-01 NOTE — Assessment & Plan Note (Signed)
The nature of cataract was discussed with the patient as well as the elective nature of surgery. The patient was reassured that surgery at a later date does not put the patient at risk for a worse outcome. It was emphasized that the need for surgery is dictated by the patient's quality of life as influenced by the cataract. Patient was instructed to maintain close follow up with their general eye care doctor.  Cataract(s) account for the patient's complaint. I discussed the risks and benefits of cataract surgery. Options were explained to the patient. The patient understands that new glasses may not improve their vision and desires to have cataract surgery. I have recommended follow up with their general eye care doctor for evaluation and consideration of cataract extraction with new intraocular lens insertion.  Will refer for evaluation of cataract, to maximize visual potential

## 2020-02-01 NOTE — Assessment & Plan Note (Signed)
No detectable diabetic retinopathy 

## 2020-02-01 NOTE — Assessment & Plan Note (Signed)
Patient already continues on oral AREDS 2 vitamin supplementation  Patient instructed to contact the office promptly new onset visual acuity decline or distortions

## 2020-02-01 NOTE — Patient Instructions (Signed)
Patient will be offered evaluation with Dr. Herbert Deaner and/or Dr. Kathlen Mody of [facility] eye care for cataract evaluation and refractive assistance

## 2020-02-01 NOTE — Progress Notes (Signed)
02/01/2020     CHIEF COMPLAINT Patient presents for Diabetic Eye Exam   HISTORY OF PRESENT ILLNESS: Garrett Clements is a 64 y.o. male who presents to the clinic today for:   HPI    Diabetic Eye Exam    Vision is stable.  Associated Symptoms Pain.  Diabetes characteristics include Type 2.  Blood sugar level is controlled.  Last Blood Glucose 134.  Last A1C 9.4.  I, the attending physician,  performed the HPI with the patient and updated documentation appropriately.          Comments    Pt referred by Dr. Gerarda Fraction for a diabetic, of 6 to 7 years duration of diabetes mellitus, and macular degeneration exam. OCT  Pt states vision has been stable. Pt states OD will close and he has a hard time keeping it open. Pt was dx with ARMD 1 year ago. Pt c/o intermittent pain in OU. Pt has seen floaters in OU. Pt states he sees a FOL when he closes his eyes.       Last edited by Hurman Horn, MD on 02/01/2020  9:51 AM. (History)      Referring physician: Redmond School, MD [address] [place],  [place] [address]  HISTORICAL INFORMATION:   Selected notes from the [facility]: No current outpatient medications on file. (Ophthalmic Drugs)   No current facility-administered medications for this visit. (Ophthalmic Drugs)   Current Outpatient Medications (Other)  Medication Sig  . acetaminophen (TYLENOL) 325 MG tablet Take by mouth.  Marland Kitchen atorvastatin (LIPITOR) 20 MG tablet Take 20 mg by mouth every morning.  . fluticasone (FLONASE) 50 MCG/ACT nasal spray Place 1 spray into both nostrils daily.  Marland Kitchen gabapentin (NEURONTIN) 300 MG capsule Take 300 mg by mouth 3 (three) times daily.  Marland Kitchen gabapentin (NEURONTIN) 800 MG tablet Take 800 mg by mouth 3 (three) times daily.  Marland Kitchen glipiZIDE (GLUCOTROL) 10 MG tablet Take 10 mg by mouth every morning.  Marland Kitchen lisinopril (PRINIVIL,ZESTRIL) 5 MG tablet Take 5 mg by mouth every morning.  . metFORMIN (GLUCOPHAGE) 500 MG tablet  TAKE 2 TABLETS BY MOUTH TWICE DAILY FOR 90 DAYS  . metoprolol succinate (TOPROL XL) 25 MG 24 hr tablet Take 1 tablet (25 mg total) by mouth daily.  . Multiple Vitamins-Minerals (CENTRUM SILVER 50+MEN) TABS Take 1 tablet by mouth at bedtime.  . Multiple Vitamins-Minerals (PRESERVISION AREDS 2+MULTI VIT PO) TAKE (1) TABLET BY MOUTHATWICE DAILY.  Marland Kitchen omeprazole (PRILOSEC) 40 MG capsule Take 40 mg by mouth daily.  . pioglitazone (ACTOS) 30 MG tablet Take 30 mg by mouth daily.  . sertraline (ZOLOFT) 50 MG tablet Take 50 mg by mouth daily.   Alveda Reasons 20 MG TABS tablet TAKE 1 TABLET DAILY WITH SUPPER.   No current facility-administered medications for this visit. (Other)      REVIEW OF SYSTEMS:    ALLERGIES No Known Allergies  PAST MEDICAL HISTORY Past Medical History:  Diagnosis Date  . Anxiety   . Aortic atherosclerosis ([place])   . Chronic atrial fibrillation [facility])    Diagnosed February 2016  . Chronic back pain   . Coronary artery calcification   . Depression   . Essential hypertension   . GERD (gastroesophageal reflux disease)   . H. pylori infection 07/2011  . Hiatal hernia   . Leukocytosis   . Memory deficits   . Migraines   . Neuropathy   . Sleep apnea  CPAP prescibed but "I don't use"  . Tubulovillous adenoma of colon 05/23/10  . Type 2 diabetes mellitus ([place])    Past Surgical History:  Procedure Laterality Date  . CHOLECYSTECTOMY  2006  . COLONOSCOPY  05/15/10   Rourk-friable anal canal, 1cm pedunculated TVA  mid-sigmoid , diminutive adjacent polyp ablated  . COLONOSCOPY WITH PROPOFOL N/A 09/02/2017   Procedure: COLONOSCOPY WITH PROPOFOL;  Surgeon: Daneil Dolin, MD;  Location: [facility];  Service: Endoscopy;  Laterality: N/A;  8:30am  . ESOPHAGOGASTRODUODENOSCOPY  07/20/2011   Dr. Gala Romney- normal esophagus-s/p maloney dialation, small hiatal hernia, hpylori +  . POLYPECTOMY  09/02/2017   Procedure: POLYPECTOMY;  Surgeon: Daneil Dolin, MD;  Location: [facility];  Service: Endoscopy;;  colon  . UMBILICAL HERNIA REPAIR  2006    FAMILY HISTORY Family History  Problem Relation Age of Onset  . Diabetes Mother   . Osteoporosis Father   . Ulcers Father   . Prostate cancer Father   . Diabetes Brother   . Diabetes Brother   . Cancer Maternal Grandfather   . Colon cancer Neg Hx     SOCIAL HISTORY Social History   Tobacco Use  . Smoking status: Current Every Day Smoker    Packs/day: 2.00    Years: 45.00    Pack years: 90.00    Types: Cigarettes    Start date: 05/19/1968  . Smokeless tobacco: Never Used  Vaping Use  . Vaping Use: Never used  Substance Use Topics  . Alcohol use: No    Alcohol/week: 0.0 standard drinks    Comment: quit; used to be heavy drinker but quit 15 years  . Drug use: No         OPHTHALMIC EXAM:  Base Eye Exam    Visual Acuity (Snellen - Linear)      Right Left   Dist [place] 20/60 20/80   Dist ph [place] 20/40 -2 20/60       Tonometry (Tonopen, 9:29 AM)      Right Left   Pressure 23 23       Pupils      Pupils Dark Light Shape React APD   Right PERRL 3 2 Round Slow None   Left PERRL 3 2 Round Slow None       Visual Fields (Counting fingers)      Left Right    Full Full       Neuro/Psych    Oriented x3: Yes   Mood/Affect: Normal       Dilation    Both eyes: 1.0% Mydriacyl, 2.5% Phenylephrine @ 9:29 AM        Slit Lamp and Fundus Exam    External Exam      Right Left   External Normal Normal       Slit Lamp Exam      Right Left   Lids/Lashes Normal Normal   Conjunctiva/Sclera White and quiet White and quiet   Cornea Clear Clear   Anterior Chamber Deep and quiet Deep and quiet   Iris Round and reactive Round and reactive   Lens 3+ Nuclear sclerosis 3+ Nuclear sclerosis   Anterior Vitreous Normal Normal       Fundus Exam      Right Left   Posterior Vitreous Normal Normal   Disc Normal Normal   C/D Ratio 0.5 0.5   Macula Intermediate age related macular degeneration, Retinal  pigment epithelial atrophy, Soft drusen Intermediate age related macular degeneration,  Retinal pigment epithelial atrophy, Soft drusen   Vessels Normal, , no DR Normal, , no DR   Periphery Normal Normal          IMAGING AND PROCEDURES  Imaging and Procedures for 02/01/20  OCT, Retina - OU - Both Eyes       Right Eye Quality was good. Scan locations included subfoveal. Central Foveal Thickness: 247. Findings include no IRF, abnormal foveal contour, no SRF.   Left Eye Quality was good. Central Foveal Thickness: 254. Findings include no IRF, retinal drusen , abnormal foveal contour, no SRF.                 ASSESSMENT/PLAN:  Intermediate stage nonexudative age-related macular degeneration of both eyes Patient already continues on oral AREDS 2 vitamin supplementation  Patient instructed to contact the office promptly new onset visual acuity decline or distortions  Type 2 diabetes mellitus No detectable diabetic retinopathy  Nuclear sclerotic cataract of both eyes The nature of cataract was discussed with the patient as well as the elective nature of surgery. The patient was reassured that surgery at a later date does not put the patient at risk for a worse outcome. It was emphasized that the need for surgery is dictated by the patient's quality of life as influenced by the cataract. Patient was instructed to maintain close follow up with their general eye care doctor.  Cataract(s) account for the patient's complaint. I discussed the risks and benefits of cataract surgery. Options were explained to the patient. The patient understands that new glasses may not improve their vision and desires to have cataract surgery. I have recommended follow up with their general eye care doctor for evaluation and consideration of cataract extraction with new intraocular lens insertion.  Will refer for evaluation of cataract, to maximize visual potential      ICD-10-CM   1. Intermediate  stage nonexudative age-related macular degeneration of both eyes  H35.3132 OCT, Retina - OU - Both Eyes  2. Type 2 diabetes mellitus with diabetic polyneuropathy, without long-term current use of insulin (HCC)  E11.42   3. Nuclear sclerotic cataract of both eyes  H25.13     1.  No specific therapy warranted at this time for age-related macular degeneration other than vitamin supplementation orally  2.  Detectable diabetic retinopathy  3.  I explained the patient and family that his cataracts, with opacification and color change are imparting 20/60 vision in my judgment to the left eye and 20/40 to the right eye and he deserves cataract evaluation and consideration of surgery to maximize his visual potential at this time in the absence of significant active maculopathy  Ophthalmic Meds Ordered this visit:  No orders of the defined types were placed in this encounter.      Return in about 6 months (around 08/01/2020) for DILATE OU, OCT.  Patient Instructions  Patient will be offered evaluation with Dr. Herbert Deaner and/or Dr. Kathlen Mody of [facility] eye care for cataract evaluation and refractive assistance    Explained the diagnoses, plan, and follow up with the patient and they expressed understanding.  Patient expressed understanding of the importance of proper follow up care.   Clent Demark Eleno Weimar M.D. Diseases & Surgery of the Retina and Vitreous Retina [facility] Diabetic [place] 02/01/20     Abbreviations: M myopia (nearsighted); A astigmatism; H hyperopia (farsighted); P presbyopia; Mrx spectacle prescription;  CTL contact lenses; OD right eye; OS left eye; OU both eyes  XT exotropia; ET esotropia; PEK  punctate epithelial keratitis; PEE punctate epithelial erosions; DES dry eye syndrome; MGD meibomian gland dysfunction; ATs artificial tears; PFAT's preservative free artificial tears; [place] nuclear sclerotic cataract; PSC posterior subcapsular cataract; ERM epi-retinal membrane; PVD posterior vitreous  detachment; RD retinal detachment; DM diabetes mellitus; DR diabetic retinopathy; NPDR non-proliferative diabetic retinopathy; PDR proliferative diabetic retinopathy; CSME clinically significant macular edema; DME diabetic macular edema; dbh dot blot hemorrhages; CWS cotton wool spot; POAG primary open angle glaucoma; C/D cup-to-disc ratio; HVF humphrey visual field; GVF goldmann visual field; OCT optical coherence tomography; IOP intraocular pressure; BRVO Branch retinal vein occlusion; CRVO central retinal vein occlusion; CRAO central retinal artery occlusion; BRAO branch retinal artery occlusion; RT retinal tear; SB scleral buckle; PPV pars plana vitrectomy; VH Vitreous hemorrhage; PRP panretinal laser photocoagulation; IVK intravitreal kenalog; VMT vitreomacular traction; MH Macular hole;  NVD neovascularization of the disc; NVE neovascularization elsewhere; AREDS age related eye disease study; ARMD age related macular degeneration; POAG primary open angle glaucoma; EBMD epithelial/anterior basement membrane dystrophy; ACIOL anterior chamber intraocular lens; IOL intraocular lens; PCIOL posterior chamber intraocular lens; Phaco/IOL phacoemulsification with intraocular lens placement; [place] photorefractive keratectomy; LASIK laser assisted in situ keratomileusis; HTN hypertension; DM diabetes mellitus; COPD chronic obstructive pulmonary disease

## 2020-04-05 ENCOUNTER — Ambulatory Visit: Payer: Medicare HMO | Admitting: Podiatry

## 2020-05-03 ENCOUNTER — Encounter: Payer: Self-pay | Admitting: Podiatry

## 2020-05-03 ENCOUNTER — Other Ambulatory Visit: Payer: Self-pay

## 2020-05-03 ENCOUNTER — Ambulatory Visit: Payer: Medicare HMO | Admitting: Podiatry

## 2020-05-03 DIAGNOSIS — L84 Corns and callosities: Secondary | ICD-10-CM | POA: Diagnosis not present

## 2020-05-03 DIAGNOSIS — I4819 Other persistent atrial fibrillation: Secondary | ICD-10-CM

## 2020-05-03 DIAGNOSIS — Z7901 Long term (current) use of anticoagulants: Secondary | ICD-10-CM

## 2020-05-03 DIAGNOSIS — B351 Tinea unguium: Secondary | ICD-10-CM

## 2020-05-03 DIAGNOSIS — E1142 Type 2 diabetes mellitus with diabetic polyneuropathy: Secondary | ICD-10-CM

## 2020-05-03 DIAGNOSIS — M79675 Pain in left toe(s): Secondary | ICD-10-CM

## 2020-05-03 DIAGNOSIS — M79674 Pain in right toe(s): Secondary | ICD-10-CM | POA: Diagnosis not present

## 2020-05-03 NOTE — Progress Notes (Signed)
  Subjective:  Patient ID: Garrett Clements, male    DOB: 01/17/1956,  MRN: [id number]  Chief Complaint  Patient presents with  . Nail Problem    Nail and callus trim     65 y.o. male returns with the above complaint. History confirmed with patient.  Doing well.  No new issues.  Still having burning and tingling.  He use the CBD cream which has been somewhat helpful.  Nails are thickened elongated and painful again. Objective:  Physical Exam: warm, good capillary refill, no trophic changes or ulcerative lesions, normal DP and PT pulses, abnormal monofilament exam with loss of protective sensation and onychomycosis x10, he has calluses submet 5, submet 1 and medial hallux bilaterally. Assessment:   1. Type 2 diabetes mellitus with diabetic polyneuropathy, without long-term current use of insulin (HCC)   2. Persistent atrial fibrillation ([place])   3. Long term (current) use of anticoagulants   4. Onychomycosis   5. Pain due to onychomycosis of toenails of both feet   6. Callus of foot      Plan:  Patient was evaluated and treated and all questions answered.  Patient educated on diabetes. Discussed proper diabetic foot care and discussed risks and complications of disease. Educated patient in depth on reasons to return to the office immediately should he/she discover anything concerning or new on the feet. All questions answered. Discussed proper shoes as well.   Discussed the etiology and treatment options for the condition in detail with the patient. Educated patient on the topical and oral treatment options for mycotic nails. Recommended debridement of the nails today. Sharp and mechanical debridement performed of all painful and mycotic nails today. Nails debrided in length and thickness using a nail nipper and a mechanical burr to level of comfort. Discussed treatment options including appropriate shoe gear. Follow up as needed for painful nails.   All symptomatic hyperkeratoses were  safely debrided with a sterile #15 blade to patient's level of comfort without incident. We discussed preventative and palliative care of these lesions including supportive and accommodative shoegear, padding, prefabricated and custom molded accommodative orthoses, use of a pumice stone and lotions/creams daily.   No follow-ups on file.

## 2020-07-18 DIAGNOSIS — E1165 Type 2 diabetes mellitus with hyperglycemia: Secondary | ICD-10-CM | POA: Diagnosis not present

## 2020-07-18 DIAGNOSIS — I10 Essential (primary) hypertension: Secondary | ICD-10-CM | POA: Diagnosis not present

## 2020-07-18 DIAGNOSIS — Z6830 Body mass index (BMI) 30.0-30.9, adult: Secondary | ICD-10-CM | POA: Diagnosis not present

## 2020-07-18 DIAGNOSIS — J449 Chronic obstructive pulmonary disease, unspecified: Secondary | ICD-10-CM | POA: Diagnosis not present

## 2020-07-18 DIAGNOSIS — E6609 Other obesity due to excess calories: Secondary | ICD-10-CM | POA: Diagnosis not present

## 2020-07-19 DIAGNOSIS — E6609 Other obesity due to excess calories: Secondary | ICD-10-CM | POA: Diagnosis not present

## 2020-07-19 DIAGNOSIS — I10 Essential (primary) hypertension: Secondary | ICD-10-CM | POA: Diagnosis not present

## 2020-07-19 DIAGNOSIS — E1165 Type 2 diabetes mellitus with hyperglycemia: Secondary | ICD-10-CM | POA: Diagnosis not present

## 2020-07-19 DIAGNOSIS — Z6830 Body mass index (BMI) 30.0-30.9, adult: Secondary | ICD-10-CM | POA: Diagnosis not present

## 2020-07-19 DIAGNOSIS — J449 Chronic obstructive pulmonary disease, unspecified: Secondary | ICD-10-CM | POA: Diagnosis not present

## 2020-08-01 ENCOUNTER — Encounter (INDEPENDENT_AMBULATORY_CARE_PROVIDER_SITE_OTHER): Payer: Medicare HMO | Admitting: Ophthalmology

## 2020-08-04 ENCOUNTER — Ambulatory Visit: Payer: Medicare HMO | Admitting: Podiatry

## 2020-08-08 ENCOUNTER — Ambulatory Visit (INDEPENDENT_AMBULATORY_CARE_PROVIDER_SITE_OTHER): Payer: Medicare HMO | Admitting: Ophthalmology

## 2020-08-08 ENCOUNTER — Encounter (INDEPENDENT_AMBULATORY_CARE_PROVIDER_SITE_OTHER): Payer: Self-pay | Admitting: Ophthalmology

## 2020-08-08 ENCOUNTER — Other Ambulatory Visit: Payer: Self-pay

## 2020-08-08 DIAGNOSIS — H353132 Nonexudative age-related macular degeneration, bilateral, intermediate dry stage: Secondary | ICD-10-CM

## 2020-08-08 DIAGNOSIS — E1142 Type 2 diabetes mellitus with diabetic polyneuropathy: Secondary | ICD-10-CM | POA: Diagnosis not present

## 2020-08-08 NOTE — Assessment & Plan Note (Signed)
Mild to moderate intermediate ARMD no signs of complications

## 2020-08-08 NOTE — Progress Notes (Signed)
08/08/2020     CHIEF COMPLAINT Patient presents for Retina Follow Up (6 month fu and OCT/Pt states, "My vision has been ok but yesterday all day it felt like I had a film over my left eye all day. It was blurry but then it went away."/A1C: not sure but it was high/LBS:256)   HISTORY OF PRESENT ILLNESS: Garrett Clements is a 65 y.o. male who presents to the clinic today for:   HPI     Retina Follow Up           Diagnosis: Dry AMD   Laterality: both eyes   Onset: 6 months ago   Severity: mild   Duration: 6 months   Course: gradually worsening   Comments: 6 month fu and OCT Pt states, "My vision has been ok but yesterday all day it felt like I had a film over my left eye all day. It was blurry but then it went away." A1C: not sure but it was high LBS:256       Last edited by Kendra Opitz, COA on 08/08/2020 10:47 AM.      Referring physician: Redmond School, MD [address] [place],  [place] [address]  HISTORICAL INFORMATION:   Selected notes from the [facility]: No current outpatient medications on file. (Ophthalmic Drugs)   No current facility-administered medications for this visit. (Ophthalmic Drugs)   Current Outpatient Medications (Other)  Medication Sig   acetaminophen (TYLENOL) 325 MG tablet Take by mouth.   atorvastatin (LIPITOR) 20 MG tablet Take 20 mg by mouth every morning.   fluticasone (FLONASE) 50 MCG/ACT nasal spray Place 1 spray into both nostrils daily.   gabapentin (NEURONTIN) 300 MG capsule Take 300 mg by mouth 3 (three) times daily.   gabapentin (NEURONTIN) 800 MG tablet Take 800 mg by mouth 3 (three) times daily.   glipiZIDE (GLUCOTROL) 10 MG tablet Take 10 mg by mouth every morning.   lisinopril (PRINIVIL,ZESTRIL) 5 MG tablet Take 5 mg by mouth every morning.   metFORMIN (GLUCOPHAGE) 500 MG tablet TAKE 2 TABLETS BY MOUTH TWICE DAILY FOR 90 DAYS   metoprolol succinate (TOPROL XL) 25 MG 24 hr tablet  Take 1 tablet (25 mg total) by mouth daily.   Multiple Vitamins-Minerals (CENTRUM SILVER 50+MEN) TABS Take 1 tablet by mouth at bedtime.   Multiple Vitamins-Minerals (PRESERVISION AREDS 2+MULTI VIT PO) TAKE (1) TABLET BY MOUTHATWICE DAILY.   omeprazole (PRILOSEC) 40 MG capsule Take 40 mg by mouth daily.   pioglitazone (ACTOS) 30 MG tablet Take 30 mg by mouth daily.   sertraline (ZOLOFT) 50 MG tablet Take 50 mg by mouth daily.    XARELTO 20 MG TABS tablet TAKE 1 TABLET DAILY WITH SUPPER.   No current facility-administered medications for this visit. (Other)      REVIEW OF SYSTEMS:    ALLERGIES No Known Allergies  PAST MEDICAL HISTORY Past Medical History:  Diagnosis Date   Anxiety    Aortic atherosclerosis (HCC)    Chronic atrial fibrillation ([place])    Diagnosed February 2016   Chronic back pain    Coronary artery calcification    Depression    Essential hypertension    GERD (gastroesophageal reflux disease)    H. pylori infection 07/2011   Hiatal hernia    Leukocytosis    Memory deficits    Migraines    Neuropathy    Sleep apnea    CPAP prescibed but "I  don't use"   Tubulovillous adenoma of colon 05/23/10   Type 2 diabetes mellitus [facility])    Past Surgical History:  Procedure Laterality Date   CHOLECYSTECTOMY  2006   COLONOSCOPY  05/15/10   Rourk-friable anal canal, 1cm pedunculated TVA  mid-sigmoid , diminutive adjacent polyp ablated   COLONOSCOPY WITH PROPOFOL N/A 09/02/2017   Procedure: COLONOSCOPY WITH PROPOFOL;  Surgeon: Daneil Dolin, MD;  [facility];  Service: Endoscopy;  Laterality: N/A;  8:30am   ESOPHAGOGASTRODUODENOSCOPY  07/20/2011   Dr. Gala Romney- normal esophagus-s/p maloney dialation, small hiatal hernia, hpylori +   POLYPECTOMY  09/02/2017   Procedure: POLYPECTOMY;  Surgeon: Daneil Dolin, MD;  Location: [facility];  Service: Endoscopy;;  colon   UMBILICAL HERNIA REPAIR  2006    FAMILY HISTORY Family History  Problem Relation Age of  Onset   Diabetes Mother    Osteoporosis Father    Ulcers Father    Prostate cancer Father    Diabetes Brother    Diabetes Brother    Cancer Maternal Grandfather    Colon cancer Neg Hx     SOCIAL HISTORY Social History   Tobacco Use   Smoking status: Every Day    Packs/day: 2.00    Years: 45.00    Pack years: 90.00    Types: Cigarettes    Start date: 05/19/1968   Smokeless tobacco: Never  Vaping Use   Vaping Use: Never used  Substance Use Topics   Alcohol use: No    Alcohol/week: 0.0 standard drinks    Comment: quit; used to be heavy drinker but quit 15 years   Drug use: No         OPHTHALMIC EXAM:  Base Eye Exam     Visual Acuity (ETDRS)       Right Left   Dist [place] 20/50 -1 20/80 -2   Dist ph [place] NI 20/70         Tonometry (Tonopen, 10:51 AM)       Right Left   Pressure 21 23         Pupils       Pupils Dark Light Shape React APD   Right PERRL 3 2 Round Slow None   Left PERRL 3 2 Round Slow None         Visual Fields (Counting fingers)       Left Right    Full Full         Extraocular Movement       Right Left    Full Full         Neuro/Psych     Oriented x3: Yes   Mood/Affect: Normal         Dilation     Both eyes: 1.0% Mydriacyl, 2.5% Phenylephrine @ 10:51 AM           Slit Lamp and Fundus Exam     External Exam       Right Left   External Normal Normal         Slit Lamp Exam       Right Left   Lids/Lashes Normal Normal   Conjunctiva/Sclera White and quiet White and quiet   Cornea Clear Clear   Anterior Chamber Deep and quiet Deep and quiet   Iris Round and reactive Round and reactive   Lens 2+ Nuclear sclerosis 2+ Nuclear sclerosis   Anterior Vitreous Normal Normal         Fundus Exam  Right Left   Posterior Vitreous Normal Normal   Disc Normal Normal   C/D Ratio 0.5 0.5   Macula Intermediate age related macular degeneration, Retinal pigment epithelial atrophy, Soft drusen Intermediate  age related macular degeneration, Retinal pigment epithelial atrophy, Soft drusen   Vessels Normal, , no DR Normal, , no DR   Periphery Normal Normal            IMAGING AND PROCEDURES  Imaging and Procedures for 08/08/20  OCT, Retina - OU - Both Eyes       Right Eye Quality was good. Scan locations included subfoveal. Central Foveal Thickness: 246. Findings include no IRF, abnormal foveal contour, no SRF, retinal drusen .   Left Eye Quality was good. Central Foveal Thickness: 243. Findings include no IRF, retinal drusen , abnormal foveal contour, no SRF.   Notes No signs of active CNVM OU will observe             ASSESSMENT/PLAN:  Type 2 diabetes mellitus No detectable diabetic retinopathy    Intermediate stage nonexudative age-related macular degeneration of both eyes Mild to moderate intermediate ARMD no signs of complications     [id number]   1. Intermediate stage nonexudative age-related macular degeneration of both eyes  H35.3132 OCT, Retina - OU - Both Eyes    2. Type 2 diabetes mellitus with diabetic polyneuropathy, without long-term current use of insulin (HCC)  E11.42       1.  No detectable diabetic retinopathy yet the patient reminded the critical importance of good blood sugar control and monitoring in order to prevent the development of diabetic eye disease and other complications systemically.  2.  Mild nuclear sclerotic cataract with no impact on acuity will recommend observation alone.  3.  Moderate nuclear sclerotic cataract does not appear to be accounting for current acuity although the patient has some lectins to peer forward with the midst of the lites from my examination.  This suggests he may have more glare and/or cataract involvement then appears clinically.  He does not have a routine eye care doctor we will suggest [facility]    Ophthalmic Meds Ordered this visit:  No orders of the defined types were placed in this  encounter.      No follow-ups on file.  There are no Patient Instructions on file for this visit.   Explained the diagnoses, plan, and follow up with the patient and they expressed understanding.  Patient expressed understanding of the importance of proper follow up care.   Clent Demark Olawale Marney M.D. Diseases & Surgery of the Retina and Vitreous Retina & Diabetic [place] 08/08/20     Abbreviations: M myopia (nearsighted); A astigmatism; H hyperopia (farsighted); P presbyopia; Mrx spectacle prescription;  CTL contact lenses; OD right eye; OS left eye; OU both eyes  XT exotropia; ET esotropia; PEK punctate epithelial keratitis; PEE punctate epithelial erosions; DES dry eye syndrome; MGD meibomian gland dysfunction; ATs artificial tears; PFAT's preservative free artificial tears; [place] nuclear sclerotic cataract; PSC posterior subcapsular cataract; ERM epi-retinal membrane; PVD posterior vitreous detachment; RD retinal detachment; DM diabetes mellitus; DR diabetic retinopathy; NPDR non-proliferative diabetic retinopathy; PDR proliferative diabetic retinopathy; CSME clinically significant macular edema; DME diabetic macular edema; dbh dot blot hemorrhages; CWS cotton wool spot; POAG primary open angle glaucoma; C/D cup-to-disc ratio; HVF humphrey visual field; GVF goldmann visual field; OCT optical coherence tomography; IOP intraocular pressure; BRVO Branch retinal vein occlusion; CRVO central retinal vein occlusion; CRAO central retinal artery occlusion; BRAO  branch retinal artery occlusion; RT retinal tear; SB scleral buckle; PPV pars plana vitrectomy; VH Vitreous hemorrhage; PRP panretinal laser photocoagulation; IVK intravitreal kenalog; VMT vitreomacular traction; MH Macular hole;  NVD neovascularization of the disc; NVE neovascularization elsewhere; AREDS age related eye disease study; ARMD age related macular degeneration; POAG primary open angle glaucoma; EBMD epithelial/anterior basement membrane  dystrophy; ACIOL anterior chamber intraocular lens; IOL intraocular lens; PCIOL posterior chamber intraocular lens; Phaco/IOL phacoemulsification with intraocular lens placement; [place] photorefractive keratectomy; LASIK laser assisted in situ keratomileusis; HTN hypertension; DM diabetes mellitus; COPD chronic obstructive pulmonary disease

## 2020-08-08 NOTE — Assessment & Plan Note (Signed)
No detectable diabetic retinopathy 

## 2020-08-09 ENCOUNTER — Ambulatory Visit: Payer: Medicare HMO | Admitting: Podiatry

## 2020-08-09 DIAGNOSIS — M79674 Pain in right toe(s): Secondary | ICD-10-CM | POA: Diagnosis not present

## 2020-08-09 DIAGNOSIS — M79675 Pain in left toe(s): Secondary | ICD-10-CM | POA: Diagnosis not present

## 2020-08-09 DIAGNOSIS — B351 Tinea unguium: Secondary | ICD-10-CM | POA: Diagnosis not present

## 2020-08-09 DIAGNOSIS — L84 Corns and callosities: Secondary | ICD-10-CM

## 2020-08-09 DIAGNOSIS — E1142 Type 2 diabetes mellitus with diabetic polyneuropathy: Secondary | ICD-10-CM

## 2020-08-09 NOTE — Progress Notes (Signed)
  Subjective:  Patient ID: Garrett Clements, male    DOB: 1955/07/29,  MRN: [id number]  Chief Complaint  Patient presents with   Diabetes    3 month follow up, diabetic nail and callus trim    65 y.o. male returns with the above complaint. History confirmed with patient.  Nails are thickened elongated and painful again. Objective:  Physical Exam: warm, good capillary refill, no trophic changes or ulcerative lesions, normal DP and PT pulses, abnormal monofilament exam with loss of protective sensation and onychomycosis x10, he has calluses submet 5, submet 1 and medial hallux bilaterally. Assessment:   1. Pain due to onychomycosis of toenails of both feet   2. Callus of foot   3. Type 2 diabetes mellitus with diabetic polyneuropathy, without long-term current use of insulin ([place])       Plan:  Patient was evaluated and treated and all questions answered.  Patient educated on diabetes. Discussed proper diabetic foot care and discussed risks and complications of disease. Educated patient in depth on reasons to return to the office immediately should he/she discover anything concerning or new on the feet. All questions answered. Discussed proper shoes as well.   Discussed the etiology and treatment options for the condition in detail with the patient. Educated patient on the topical and oral treatment options for mycotic nails. Recommended debridement of the nails today. Sharp and mechanical debridement performed of all painful and mycotic nails today. Nails debrided in length and thickness using a nail nipper and a mechanical burr to level of comfort. Discussed treatment options including appropriate shoe gear. Follow up as needed for painful nails.   All symptomatic hyperkeratoses were safely debrided with a sterile #15 blade to patient's level of comfort without incident. We discussed preventative and palliative care of these lesions including supportive and accommodative shoegear, padding,  prefabricated and custom molded accommodative orthoses, use of a pumice stone and lotions/creams daily.   Return in about 3 months (around 11/09/2020) for at risk diabetic foot care.

## 2020-11-10 ENCOUNTER — Ambulatory Visit: Payer: Medicare HMO | Admitting: Podiatry

## 2020-11-16 DIAGNOSIS — H353131 Nonexudative age-related macular degeneration, bilateral, early dry stage: Secondary | ICD-10-CM | POA: Diagnosis not present

## 2020-11-16 DIAGNOSIS — H25813 Combined forms of age-related cataract, bilateral: Secondary | ICD-10-CM | POA: Diagnosis not present

## 2020-11-24 ENCOUNTER — Ambulatory Visit: Payer: Medicare HMO | Admitting: Podiatry

## 2020-12-06 ENCOUNTER — Other Ambulatory Visit: Payer: Self-pay

## 2020-12-06 ENCOUNTER — Ambulatory Visit: Payer: Medicare HMO | Admitting: Podiatry

## 2020-12-06 ENCOUNTER — Ambulatory Visit (INDEPENDENT_AMBULATORY_CARE_PROVIDER_SITE_OTHER): Payer: Medicare HMO | Admitting: Podiatry

## 2020-12-06 DIAGNOSIS — M79674 Pain in right toe(s): Secondary | ICD-10-CM | POA: Diagnosis not present

## 2020-12-06 DIAGNOSIS — M2141 Flat foot [pes planus] (acquired), right foot: Secondary | ICD-10-CM

## 2020-12-06 DIAGNOSIS — I4819 Other persistent atrial fibrillation: Secondary | ICD-10-CM

## 2020-12-06 DIAGNOSIS — M79675 Pain in left toe(s): Secondary | ICD-10-CM | POA: Diagnosis not present

## 2020-12-06 DIAGNOSIS — E1142 Type 2 diabetes mellitus with diabetic polyneuropathy: Secondary | ICD-10-CM

## 2020-12-06 DIAGNOSIS — M2142 Flat foot [pes planus] (acquired), left foot: Secondary | ICD-10-CM

## 2020-12-06 DIAGNOSIS — L84 Corns and callosities: Secondary | ICD-10-CM | POA: Diagnosis not present

## 2020-12-06 DIAGNOSIS — B351 Tinea unguium: Secondary | ICD-10-CM | POA: Diagnosis not present

## 2020-12-06 DIAGNOSIS — M7741 Metatarsalgia, right foot: Secondary | ICD-10-CM

## 2020-12-06 DIAGNOSIS — M7742 Metatarsalgia, left foot: Secondary | ICD-10-CM

## 2020-12-06 NOTE — Progress Notes (Signed)
  Subjective:  Patient ID: Garrett Clements, male    DOB: 1955/03/08,  MRN: [id number]  Chief Complaint  Patient presents with   Nail Problem    Pain due to onychomycosis.     65 y.o. male returns with the above complaint. History confirmed with patient.  Nails are thickened elongated and painful again. Objective:  Physical Exam: warm, good capillary refill, no trophic changes or ulcerative lesions, normal DP and PT pulses, abnormal monofilament exam with loss of protective sensation and onychomycosis x10, he has calluses submet 5, submet 1 and medial hallux bilaterally.  He has compensated pes planus and metatarsalgia bilateral Assessment:   1. Pain due to onychomycosis of toenails of both feet   2. Callus of foot   3. Type 2 diabetes mellitus with diabetic polyneuropathy, without long-term current use of insulin (HCC)   4. Persistent atrial fibrillation ([place])   5. Pes planus of both feet   6. Metatarsalgia of both feet        Plan:  Patient was evaluated and treated and all questions answered.  Patient educated on diabetes. Discussed proper diabetic foot care and discussed risks and complications of disease. Educated patient in depth on reasons to return to the office immediately should he/she discover anything concerning or new on the feet. All questions answered. Discussed proper shoes as well.   He has a decent amount of metatarsal pressure with metatarsalgia on both feet.  I think he would benefit from diabetic custom insoles and if extra-depth shoes to offload pressure and medicate his preulcerative calluses from coming ulcerations.  He will be fitted for these.  Discussed the etiology and treatment options for the condition in detail with the patient. Educated patient on the topical and oral treatment options for mycotic nails. Recommended debridement of the nails today. Sharp and mechanical debridement performed of all painful and mycotic nails today. Nails debrided in length  and thickness using a nail nipper and a mechanical burr to level of comfort. Discussed treatment options including appropriate shoe gear. Follow up as needed for painful nails.   All symptomatic hyperkeratoses were safely debrided with a sterile #15 blade to patient's level of comfort without incident. We discussed preventative and palliative care of these lesions including supportive and accommodative shoegear, padding, prefabricated and custom molded accommodative orthoses, use of a pumice stone and lotions/creams daily.   Return in about 3 months (around 03/08/2021) for at risk diabetic foot care.

## 2020-12-08 ENCOUNTER — Other Ambulatory Visit: Payer: Medicare HMO

## 2021-03-09 ENCOUNTER — Ambulatory Visit: Payer: Medicare HMO | Admitting: Podiatry

## 2021-08-14 ENCOUNTER — Encounter (INDEPENDENT_AMBULATORY_CARE_PROVIDER_SITE_OTHER): Payer: Self-pay | Admitting: Ophthalmology

## 2021-08-14 ENCOUNTER — Ambulatory Visit (INDEPENDENT_AMBULATORY_CARE_PROVIDER_SITE_OTHER): Payer: Medicare HMO | Admitting: Ophthalmology

## 2021-08-14 DIAGNOSIS — H353132 Nonexudative age-related macular degeneration, bilateral, intermediate dry stage: Secondary | ICD-10-CM | POA: Diagnosis not present

## 2021-08-14 DIAGNOSIS — H2513 Age-related nuclear cataract, bilateral: Secondary | ICD-10-CM

## 2021-08-14 NOTE — Progress Notes (Signed)
08/14/2021     CHIEF COMPLAINT Patient presents for  Chief Complaint  Patient presents with   Macular Degeneration      HISTORY OF PRESENT ILLNESS: Garrett Clements is a 66 y.o. male who presents to the clinic today for:   HPI   1 YR FU OU FP. Patient reports he does not know his regular eye doctor name. Patient reports "it seems like when I look at things a film will come over my eyes. They were talking about some surgery but I don't know." Patient complains of bright light bothering him, patient reports he is "scared to do the cataract surgery."  Patient seems to not be able to remember things well, information.  Patient states he thinks he uses prescription eye drops but doesn't know what they are called and does not know if they are prescription or not. Patient states he has floaters in both eyes, off and on, longstanding. Patient reports he does not check his blood sugar often, does not know his last A1C. During [facility] photos patient had to stop between each photo due to the flash of light when taking the photo hurting the patient, according to him it hurt him a lot. Last edited by Laurin Coder on 08/14/2021 11:01 AM.      Referring physician: Redmond School, MD [address] [place],  [place] [address]  HISTORICAL INFORMATION:   Selected notes from the MEDICAL RECORD NUMBER       CURRENT MEDICATIONS: No current outpatient medications on file. (Ophthalmic Drugs)   No current facility-administered medications for this visit. (Ophthalmic Drugs)   Current Outpatient Medications (Other)  Medication Sig   acetaminophen (TYLENOL) 325 MG tablet Take by mouth.   atorvastatin (LIPITOR) 20 MG tablet Take 20 mg by mouth every morning.   fluticasone (FLONASE) 50 MCG/ACT nasal spray Place 1 spray into both nostrils daily.   gabapentin (NEURONTIN) 300 MG capsule Take 300 mg by mouth 3 (three) times daily.   gabapentin (NEURONTIN) 800 MG tablet Take 800 mg by mouth 3  (three) times daily.   glipiZIDE (GLUCOTROL) 10 MG tablet Take 10 mg by mouth every morning.   lisinopril (PRINIVIL,ZESTRIL) 5 MG tablet Take 5 mg by mouth every morning.   metFORMIN (GLUCOPHAGE) 500 MG tablet TAKE 2 TABLETS BY MOUTH TWICE DAILY FOR 90 DAYS   metoprolol succinate (TOPROL XL) 25 MG 24 hr tablet Take 1 tablet (25 mg total) by mouth daily.   Multiple Vitamins-Minerals (CENTRUM SILVER 50+MEN) TABS Take 1 tablet by mouth at bedtime.   Multiple Vitamins-Minerals (PRESERVISION AREDS 2+MULTI VIT PO) TAKE (1) TABLET BY MOUTHATWICE DAILY.   omeprazole (PRILOSEC) 40 MG capsule Take 40 mg by mouth daily.   pioglitazone (ACTOS) 30 MG tablet Take 30 mg by mouth daily.   sertraline (ZOLOFT) 50 MG tablet Take 50 mg by mouth daily.    XARELTO 20 MG TABS tablet TAKE 1 TABLET DAILY WITH SUPPER.   No current facility-administered medications for this visit. (Other)      REVIEW OF SYSTEMS: ROS   Negative for: Constitutional, Gastrointestinal, Neurological, Skin, Genitourinary, Musculoskeletal, HENT, Endocrine, Cardiovascular, Eyes, Respiratory, Psychiatric, Allergic/Imm, Heme/Lymph Last edited by Hurman Horn, MD on 08/14/2021 11:22 AM.       ALLERGIES No Known Allergies  PAST MEDICAL HISTORY Past Medical History:  Diagnosis Date   Anxiety    Aortic atherosclerosis ([place])    Chronic atrial fibrillation [facility])    Diagnosed February 2016   Chronic back pain  Coronary artery calcification    Depression    Essential hypertension    GERD (gastroesophageal reflux disease)    H. pylori infection 07/2011   Hiatal hernia    Leukocytosis    Memory deficits    Migraines    Neuropathy    Sleep apnea    CPAP prescibed but "I don't use"   Tubulovillous adenoma of colon 05/23/10   Type 2 diabetes mellitus ([place])    Past Surgical History:  Procedure Laterality Date   CHOLECYSTECTOMY  2006   COLONOSCOPY  05/15/10   Rourk-friable anal canal, 1cm pedunculated TVA  mid-sigmoid ,  diminutive adjacent polyp ablated   COLONOSCOPY WITH PROPOFOL N/A 09/02/2017   Procedure: COLONOSCOPY WITH PROPOFOL;  Surgeon: Daneil Dolin, MD;  [facility];  Service: Endoscopy;  Laterality: N/A;  8:30am   ESOPHAGOGASTRODUODENOSCOPY  07/20/2011   Dr. Gala Romney- normal esophagus-s/p maloney dialation, small hiatal hernia, hpylori +   POLYPECTOMY  09/02/2017   Procedure: POLYPECTOMY;  Surgeon: Daneil Dolin, MD;  Location: [facility];  Service: Endoscopy;;  colon   UMBILICAL HERNIA REPAIR  2006    FAMILY HISTORY Family History  Problem Relation Age of Onset   Diabetes Mother    Osteoporosis Father    Ulcers Father    Prostate cancer Father    Diabetes Brother    Diabetes Brother    Cancer Maternal Grandfather    Colon cancer Neg Hx     SOCIAL HISTORY Social History   Tobacco Use   Smoking status: Every Day    Packs/day: 2.00    Years: 45.00    Total pack years: 90.00    Types: Cigarettes    Start date: 05/19/1968   Smokeless tobacco: Never  Vaping Use   Vaping Use: Never used  Substance Use Topics   Alcohol use: No    Alcohol/week: 0.0 standard drinks of alcohol    Comment: quit; used to be heavy drinker but quit 15 years   Drug use: No         OPHTHALMIC EXAM:  Base Eye Exam     Visual Acuity (ETDRS)       Right Left   Dist [place] 20/40 -2 20/50   Dist ph [place] NI 20/30         Tonometry (Tonopen, 10:52 AM)       Right Left   Pressure 13 18         Pupils       Pupils Dark Light APD   Right PERRL 4 3 None   Left PERRL 4 3 None         Visual Fields (Counting fingers)       Left Right    Full Full         Extraocular Movement       Right Left    Full Full         Neuro/Psych     Oriented x3: Yes   Mood/Affect: Normal         Dilation     Both eyes: 1.0% Mydriacyl, 2.5% Phenylephrine @ 10:52 AM           Slit Lamp and Fundus Exam     External Exam       Right Left   External Normal Normal          Slit Lamp Exam       Right Left   Lids/Lashes Normal Normal   Conjunctiva/Sclera White  and quiet White and quiet   Cornea Clear Clear   Anterior Chamber Deep and quiet Deep and quiet   Iris Round and reactive Round and reactive   Lens 2+ Nuclear sclerosis 2+ Nuclear sclerosis   Anterior Vitreous Normal Normal         Fundus Exam       Right Left   Posterior Vitreous Normal Normal   Disc Normal Normal   C/D Ratio 0.5 0.5   Macula Intermediate age related macular degeneration, Retinal pigment epithelial atrophy, Soft drusen Intermediate age related macular degeneration, Retinal pigment epithelial atrophy, Soft drusen   Vessels Normal, , no DR Normal, , no DR   Periphery Normal Normal            IMAGING AND PROCEDURES  Imaging and Procedures for 08/14/21  Color Fundus Photography Optos - OU - Both Eyes       Right Eye Progression has been stable. Disc findings include normal observations. Macula : drusen.   Left Eye Progression has been stable. Macula : drusen. Vessels : normal observations. Periphery : normal observations.              ASSESSMENT/PLAN:  Intermediate stage nonexudative age-related macular degeneration of both eyes Stable OU  Nuclear sclerotic cataract of both eyes Stable OU follow-up Dr. Katy Fitch as scheduled     ICD-10-CM   1. Intermediate stage nonexudative age-related macular degeneration of both eyes  H35.3132 Color Fundus Photography Optos - OU - Both Eyes    2. Nuclear sclerotic cataract of both eyes  H25.13       1.  Mild ARMD, optional use of AREDS 2 formulation  2.  Bilateral cataract, follow-up Dr. Katy Fitch as scheduled, early stage at this time  3.  Ophthalmic Meds Ordered this visit:  No orders of the defined types were placed in this encounter.      Return in about 1 year (around 08/15/2022) for DILATE OU, OCT.  There are no Patient Instructions on file for this visit.   Explained the diagnoses, plan, and follow up  with the patient and they expressed understanding.  Patient expressed understanding of the importance of proper follow up care.   Clent Demark Sadeen Wiegel M.D. Diseases & Surgery of the Retina and Vitreous [facility]ina [facility] Diabetic [place] 08/14/21     Abbreviations: M myopia (nearsighted); A astigmatism; H hyperopia (farsighted); P presbyopia; Mrx spectacle prescription;  CTL contact lenses; OD right eye; OS left eye; OU both eyes  XT exotropia; ET esotropia; PEK punctate epithelial keratitis; PEE punctate epithelial erosions; DES dry eye syndrome; MGD meibomian gland dysfunction; ATs artificial tears; PFAT's preservative free artificial tears; [place] nuclear sclerotic cataract; PSC posterior subcapsular cataract; ERM epi-retinal membrane; PVD posterior vitreous detachment; RD retinal detachment; DM diabetes mellitus; DR diabetic retinopathy; NPDR non-proliferative diabetic retinopathy; PDR proliferative diabetic retinopathy; CSME clinically significant macular edema; DME diabetic macular edema; dbh dot blot hemorrhages; CWS cotton wool spot; POAG primary open angle glaucoma; C/D cup-to-disc ratio; HVF humphrey visual field; GVF goldmann visual field; OCT optical coherence tomography; IOP intraocular pressure; BRVO Branch retinal vein occlusion; CRVO central retinal vein occlusion; CRAO central retinal artery occlusion; BRAO branch retinal artery occlusion; RT retinal tear; SB scleral buckle; PPV pars plana vitrectomy; VH Vitreous hemorrhage; PRP panretinal laser photocoagulation; IVK intravitreal kenalog; VMT vitreomacular traction; MH Macular hole;  NVD neovascularization of the disc; NVE neovascularization elsewhere; AREDS age related eye disease study; ARMD age related macular degeneration; POAG primary open angle glaucoma; EBMD  epithelial/anterior basement membrane dystrophy; ACIOL anterior chamber intraocular lens; IOL intraocular lens; PCIOL posterior chamber intraocular lens; Phaco/IOL phacoemulsification with  intraocular lens placement; [place] photorefractive keratectomy; LASIK laser assisted in situ keratomileusis; HTN hypertension; DM diabetes mellitus; COPD chronic obstructive pulmonary disease

## 2021-08-14 NOTE — Assessment & Plan Note (Signed)
Stable OU 

## 2021-08-14 NOTE — Assessment & Plan Note (Signed)
Stable OU follow-up Dr. Katy Fitch as scheduled

## 2021-10-11 ENCOUNTER — Encounter: Payer: Self-pay | Admitting: Podiatry

## 2021-10-11 ENCOUNTER — Ambulatory Visit: Payer: Medicare HMO | Admitting: Podiatry

## 2021-10-11 DIAGNOSIS — E1142 Type 2 diabetes mellitus with diabetic polyneuropathy: Secondary | ICD-10-CM

## 2021-10-11 DIAGNOSIS — L84 Corns and callosities: Secondary | ICD-10-CM

## 2021-10-11 DIAGNOSIS — E119 Type 2 diabetes mellitus without complications: Secondary | ICD-10-CM

## 2021-10-11 DIAGNOSIS — M79675 Pain in left toe(s): Secondary | ICD-10-CM | POA: Diagnosis not present

## 2021-10-11 DIAGNOSIS — B351 Tinea unguium: Secondary | ICD-10-CM | POA: Diagnosis not present

## 2021-10-11 DIAGNOSIS — M79674 Pain in right toe(s): Secondary | ICD-10-CM

## 2021-10-11 DIAGNOSIS — Z7901 Long term (current) use of anticoagulants: Secondary | ICD-10-CM

## 2021-10-15 NOTE — Progress Notes (Signed)
  Subjective:  Patient ID: Garrett Clements, male    DOB: 04-25-1955,  MRN: [id number]  Chief Complaint  Patient presents with   Nail Problem    Thick painful toenails, 3 month follow up    Callouses    Painful callus lesions   Diabetes   Peripheral Neuropathy    66 y.o. male returns with the above complaint. History confirmed with patient.  Nails are thickened elongated and painful again.  Calluses have returned as well.  He is still on the blood thinner.  Says the numbness continues to worsen Objective:  Physical Exam: warm, good capillary refill, no trophic changes or ulcerative lesions, normal DP and PT pulses, abnormal monofilament exam with loss of protective sensation and onychomycosis x10, he has calluses submet 5, submet 1 and medial hallux bilaterally.  He has compensated pes planus and metatarsalgia bilateral Assessment:   1. Pain due to onychomycosis of toenails of both feet   2. Callus of foot   3. Type 2 diabetes mellitus with diabetic polyneuropathy, without long-term current use of insulin ([place])   4. Long term (current) use of anticoagulants        Plan:  Patient was evaluated and treated and all questions answered.  Patient educated on diabetes. Discussed proper diabetic foot care and discussed risks and complications of disease. Educated patient in depth on reasons to return to the office immediately should he/she discover anything concerning or new on the feet. All questions answered. Discussed proper shoes as well.   Annual comprehensive at risk diabetic foot exam was performed today   Discussed the etiology and treatment options for the condition in detail with the patient. Educated patient on the topical and oral treatment options for mycotic nails. Recommended debridement of the nails today. Sharp and mechanical debridement performed of all painful and mycotic nails today. Nails debrided in length and thickness using a nail nipper and a mechanical burr to  level of comfort. Discussed treatment options including appropriate shoe gear. Follow up as needed for painful nails.   All symptomatic hyperkeratoses were safely debrided with a sterile #15 blade to patient's level of comfort without incident. We discussed preventative and palliative care of these lesions including supportive and accommodative shoegear, padding, prefabricated and custom molded accommodative orthoses, use of a pumice stone and lotions/creams daily.   Return if symptoms worsen or fail to improve, for at risk diabetic foot care.

## 2021-12-13 DIAGNOSIS — Z0001 Encounter for general adult medical examination with abnormal findings: Secondary | ICD-10-CM | POA: Diagnosis not present

## 2021-12-13 DIAGNOSIS — R69 Illness, unspecified: Secondary | ICD-10-CM | POA: Diagnosis not present

## 2021-12-13 DIAGNOSIS — F3341 Major depressive disorder, recurrent, in partial remission: Secondary | ICD-10-CM | POA: Diagnosis not present

## 2021-12-13 DIAGNOSIS — J449 Chronic obstructive pulmonary disease, unspecified: Secondary | ICD-10-CM | POA: Diagnosis not present

## 2021-12-13 DIAGNOSIS — I4891 Unspecified atrial fibrillation: Secondary | ICD-10-CM | POA: Diagnosis not present

## 2021-12-13 DIAGNOSIS — Z1331 Encounter for screening for depression: Secondary | ICD-10-CM | POA: Diagnosis not present

## 2021-12-13 DIAGNOSIS — I10 Essential (primary) hypertension: Secondary | ICD-10-CM | POA: Diagnosis not present

## 2021-12-13 DIAGNOSIS — E1140 Type 2 diabetes mellitus with diabetic neuropathy, unspecified: Secondary | ICD-10-CM | POA: Diagnosis not present

## 2021-12-13 DIAGNOSIS — Z125 Encounter for screening for malignant neoplasm of prostate: Secondary | ICD-10-CM | POA: Diagnosis not present

## 2021-12-13 DIAGNOSIS — Z6830 Body mass index (BMI) 30.0-30.9, adult: Secondary | ICD-10-CM | POA: Diagnosis not present

## 2021-12-13 DIAGNOSIS — E6609 Other obesity due to excess calories: Secondary | ICD-10-CM | POA: Diagnosis not present

## 2021-12-13 DIAGNOSIS — E1165 Type 2 diabetes mellitus with hyperglycemia: Secondary | ICD-10-CM | POA: Diagnosis not present

## 2021-12-20 DIAGNOSIS — E039 Hypothyroidism, unspecified: Secondary | ICD-10-CM | POA: Diagnosis not present

## 2021-12-20 DIAGNOSIS — Z0001 Encounter for general adult medical examination with abnormal findings: Secondary | ICD-10-CM | POA: Diagnosis not present

## 2021-12-20 DIAGNOSIS — D518 Other vitamin B12 deficiency anemias: Secondary | ICD-10-CM | POA: Diagnosis not present

## 2021-12-20 DIAGNOSIS — E559 Vitamin D deficiency, unspecified: Secondary | ICD-10-CM | POA: Diagnosis not present

## 2021-12-20 DIAGNOSIS — Z125 Encounter for screening for malignant neoplasm of prostate: Secondary | ICD-10-CM | POA: Diagnosis not present

## 2022-03-14 DIAGNOSIS — Z6831 Body mass index (BMI) 31.0-31.9, adult: Secondary | ICD-10-CM | POA: Diagnosis not present

## 2022-03-14 DIAGNOSIS — E6609 Other obesity due to excess calories: Secondary | ICD-10-CM | POA: Diagnosis not present

## 2022-03-14 DIAGNOSIS — J449 Chronic obstructive pulmonary disease, unspecified: Secondary | ICD-10-CM | POA: Diagnosis not present

## 2022-03-14 DIAGNOSIS — I4891 Unspecified atrial fibrillation: Secondary | ICD-10-CM | POA: Diagnosis not present

## 2022-03-14 DIAGNOSIS — I10 Essential (primary) hypertension: Secondary | ICD-10-CM | POA: Diagnosis not present

## 2022-03-14 DIAGNOSIS — E1140 Type 2 diabetes mellitus with diabetic neuropathy, unspecified: Secondary | ICD-10-CM | POA: Diagnosis not present

## 2022-03-14 DIAGNOSIS — E1165 Type 2 diabetes mellitus with hyperglycemia: Secondary | ICD-10-CM | POA: Diagnosis not present

## 2022-04-26 ENCOUNTER — Telehealth: Payer: Self-pay | Admitting: *Deleted

## 2022-05-01 NOTE — Telephone Encounter (Signed)
Taken care of

## 2022-05-30 DIAGNOSIS — E1165 Type 2 diabetes mellitus with hyperglycemia: Secondary | ICD-10-CM | POA: Diagnosis not present

## 2022-05-30 DIAGNOSIS — J449 Chronic obstructive pulmonary disease, unspecified: Secondary | ICD-10-CM | POA: Diagnosis not present

## 2022-05-30 DIAGNOSIS — E1140 Type 2 diabetes mellitus with diabetic neuropathy, unspecified: Secondary | ICD-10-CM | POA: Diagnosis not present

## 2022-05-30 DIAGNOSIS — I10 Essential (primary) hypertension: Secondary | ICD-10-CM | POA: Diagnosis not present

## 2022-05-30 DIAGNOSIS — R69 Illness, unspecified: Secondary | ICD-10-CM | POA: Diagnosis not present

## 2022-05-30 DIAGNOSIS — E6609 Other obesity due to excess calories: Secondary | ICD-10-CM | POA: Diagnosis not present

## 2022-05-30 DIAGNOSIS — I4891 Unspecified atrial fibrillation: Secondary | ICD-10-CM | POA: Diagnosis not present

## 2022-05-30 DIAGNOSIS — Z6831 Body mass index (BMI) 31.0-31.9, adult: Secondary | ICD-10-CM | POA: Diagnosis not present

## 2022-06-13 ENCOUNTER — Encounter: Payer: Self-pay | Admitting: Podiatry

## 2022-06-13 ENCOUNTER — Ambulatory Visit: Payer: Medicare HMO | Admitting: Podiatry

## 2022-06-13 DIAGNOSIS — M79675 Pain in left toe(s): Secondary | ICD-10-CM | POA: Diagnosis not present

## 2022-06-13 DIAGNOSIS — M79674 Pain in right toe(s): Secondary | ICD-10-CM | POA: Diagnosis not present

## 2022-06-13 DIAGNOSIS — B351 Tinea unguium: Secondary | ICD-10-CM | POA: Diagnosis not present

## 2022-06-13 DIAGNOSIS — E1142 Type 2 diabetes mellitus with diabetic polyneuropathy: Secondary | ICD-10-CM

## 2022-06-13 DIAGNOSIS — L84 Corns and callosities: Secondary | ICD-10-CM | POA: Diagnosis not present

## 2022-06-14 NOTE — Progress Notes (Signed)
  Subjective:  Patient ID: Garrett Clements, male    DOB: Jun 07, 1955,  MRN: [id number]  Chief Complaint  Patient presents with   Nail Problem    Thick painful toenails   Diabetes    Blood sugar has been as high as 435 this week    67 y.o. male returns with the above complaint. History confirmed with patient.  Nails are thickened elongated and painful again.  Calluses have returned as well.  He is still on the blood thinner.  Numbness and sensation is unchanged  Objective:  Physical Exam: warm, good capillary refill, no trophic changes or ulcerative lesions, normal DP and PT pulses, abnormal monofilament exam with loss of protective sensation and onychomycosis x10, he has calluses submet 5, submet 1 and medial hallux bilaterally.  He has compensated pes planus and metatarsalgia bilateral  Assessment:   1. Pain due to onychomycosis of toenails of both feet   2. Callus of foot   3. Type 2 diabetes mellitus with diabetic polyneuropathy, without long-term current use of insulin        Plan:  Patient was evaluated and treated and all questions answered.  Patient educated on diabetes. Discussed proper diabetic foot care and discussed risks and complications of disease. Educated patient in depth on reasons to return to the office immediately should he/she discover anything concerning or new on the feet. All questions answered. Discussed proper shoes as well.   Discussed the etiology and treatment options for the condition in detail with the patient. Educated patient on the topical and oral treatment options for mycotic nails. Recommended debridement of the nails today. Sharp and mechanical debridement performed of all painful and mycotic nails today. Nails debrided in length and thickness using a nail nipper and a mechanical burr to level of comfort. Discussed treatment options including appropriate shoe gear. Follow up as needed for painful nails.  All symptomatic hyperkeratoses were safely  debrided with a sterile #15 blade to patient's level of comfort without incident. We discussed preventative and palliative care of these lesions including supportive and accommodative shoegear, padding, prefabricated and custom molded accommodative orthoses, use of a pumice stone and lotions/creams daily.   Return in 4 months (on 10/13/2022) for at risk diabetic foot care.

## 2022-07-19 ENCOUNTER — Encounter: Payer: Self-pay | Admitting: *Deleted

## 2022-08-08 DIAGNOSIS — L570 Actinic keratosis: Secondary | ICD-10-CM | POA: Diagnosis not present

## 2022-08-08 DIAGNOSIS — X32XXXA Exposure to sunlight, initial encounter: Secondary | ICD-10-CM | POA: Diagnosis not present

## 2022-08-08 DIAGNOSIS — L720 Epidermal cyst: Secondary | ICD-10-CM | POA: Diagnosis not present

## 2022-08-16 ENCOUNTER — Encounter (INDEPENDENT_AMBULATORY_CARE_PROVIDER_SITE_OTHER): Payer: Medicare HMO | Admitting: Ophthalmology

## 2022-11-26 DIAGNOSIS — I4891 Unspecified atrial fibrillation: Secondary | ICD-10-CM | POA: Diagnosis not present

## 2022-11-26 DIAGNOSIS — D6869 Other thrombophilia: Secondary | ICD-10-CM | POA: Diagnosis not present

## 2022-11-26 DIAGNOSIS — E782 Mixed hyperlipidemia: Secondary | ICD-10-CM | POA: Diagnosis not present

## 2022-11-26 DIAGNOSIS — E1140 Type 2 diabetes mellitus with diabetic neuropathy, unspecified: Secondary | ICD-10-CM | POA: Diagnosis not present

## 2022-12-19 ENCOUNTER — Encounter: Payer: Self-pay | Admitting: Podiatry

## 2022-12-19 ENCOUNTER — Ambulatory Visit: Payer: Medicare HMO | Admitting: Podiatry

## 2022-12-19 VITALS — BP 138/83 | HR 89

## 2022-12-19 DIAGNOSIS — E1142 Type 2 diabetes mellitus with diabetic polyneuropathy: Secondary | ICD-10-CM | POA: Diagnosis not present

## 2022-12-19 DIAGNOSIS — D518 Other vitamin B12 deficiency anemias: Secondary | ICD-10-CM | POA: Diagnosis not present

## 2022-12-19 DIAGNOSIS — E559 Vitamin D deficiency, unspecified: Secondary | ICD-10-CM | POA: Diagnosis not present

## 2022-12-19 DIAGNOSIS — E039 Hypothyroidism, unspecified: Secondary | ICD-10-CM | POA: Diagnosis not present

## 2022-12-19 DIAGNOSIS — E1140 Type 2 diabetes mellitus with diabetic neuropathy, unspecified: Secondary | ICD-10-CM | POA: Diagnosis not present

## 2022-12-19 DIAGNOSIS — Z0001 Encounter for general adult medical examination with abnormal findings: Secondary | ICD-10-CM | POA: Diagnosis not present

## 2022-12-19 DIAGNOSIS — L84 Corns and callosities: Secondary | ICD-10-CM

## 2022-12-19 DIAGNOSIS — B351 Tinea unguium: Secondary | ICD-10-CM | POA: Diagnosis not present

## 2022-12-19 DIAGNOSIS — M79674 Pain in right toe(s): Secondary | ICD-10-CM | POA: Diagnosis not present

## 2022-12-19 DIAGNOSIS — Z125 Encounter for screening for malignant neoplasm of prostate: Secondary | ICD-10-CM | POA: Diagnosis not present

## 2022-12-19 DIAGNOSIS — M79675 Pain in left toe(s): Secondary | ICD-10-CM

## 2022-12-20 LAB — TSH: TSH: 1.92 (ref 0.41–5.90)

## 2022-12-20 LAB — BASIC METABOLIC PANEL
BUN: 15 (ref 4–21)
Creatinine: 1 (ref 0.6–1.3)
Glucose: 195

## 2022-12-20 LAB — COMPREHENSIVE METABOLIC PANEL: eGFR: 87

## 2022-12-20 LAB — LIPID PANEL
LDL Cholesterol: 73
Triglycerides: 211 — AB (ref 40–160)

## 2022-12-20 LAB — MICROALBUMIN / CREATININE URINE RATIO: Microalb Creat Ratio: 56

## 2022-12-20 LAB — VITAMIN D 25 HYDROXY (VIT D DEFICIENCY, FRACTURES): Vit D, 25-Hydroxy: 29.8

## 2022-12-20 NOTE — Progress Notes (Signed)
  Subjective:  Patient ID: Garrett Clements, male    DOB: 1955/10/15,  MRN: [id number]  Chief Complaint  Patient presents with   Diabetes    "Trim his toenails and calluses.  Do a Diabetic check."    67 y.o. male returns with the above complaint. History confirmed with patient.  Nails are thickened elongated and painful again.  Calluses have returned as well.  He is still on the blood thinner.  Numbness and sensation is unchanged.  He is here with his daughter-in-law.  Says his diabetes has been poorly controlled recently.  Objective:  Physical Exam: warm, good capillary refill, no trophic changes or ulcerative lesions, normal DP and PT pulses, abnormal monofilament exam with loss of protective sensation and onychomycosis x10, he has calluses submet 5, submet 1 and medial hallux bilaterally.  He has compensated pes planus and metatarsalgia bilateral  Assessment:   1. Pain due to onychomycosis of toenails of both feet   2. Callus of foot   3. Type 2 diabetes mellitus with diabetic polyneuropathy, without long-term current use of insulin ([facility])        Plan:  Patient was evaluated and treated and all questions answered.  Patient educated on diabetes. Discussed proper diabetic foot care and discussed risks and complications of disease. Educated patient in depth on reasons to return to the office immediately should he/she discover anything concerning or new on the feet. All questions answered. Discussed proper shoes as well.   Discussed the etiology and treatment options for the condition in detail with the patient. Educated patient on the topical and oral treatment options for mycotic nails. Recommended debridement of the nails today. Sharp and mechanical debridement performed of all painful and mycotic nails today. Nails debrided in length and thickness using a nail nipper and a mechanical burr to level of comfort. Discussed treatment options including appropriate shoe gear. Follow up as  needed for painful nails.  All symptomatic hyperkeratoses were safely debrided with a sterile #15 blade to patient's level of comfort without incident. We discussed preventative and palliative care of these lesions including supportive and accommodative shoegear, padding, prefabricated and custom molded accommodative orthoses, use of a pumice stone and lotions/creams daily.   Return in about 4 months (around 04/21/2023) for at risk diabetic foot care.

## 2023-01-09 DIAGNOSIS — Z0120 Encounter for dental examination and cleaning without abnormal findings: Secondary | ICD-10-CM | POA: Diagnosis not present

## 2023-01-26 DIAGNOSIS — Z794 Long term (current) use of insulin: Secondary | ICD-10-CM | POA: Diagnosis not present

## 2023-01-26 DIAGNOSIS — E1140 Type 2 diabetes mellitus with diabetic neuropathy, unspecified: Secondary | ICD-10-CM | POA: Diagnosis not present

## 2023-01-26 DIAGNOSIS — I4891 Unspecified atrial fibrillation: Secondary | ICD-10-CM | POA: Diagnosis not present

## 2023-01-26 DIAGNOSIS — D6869 Other thrombophilia: Secondary | ICD-10-CM | POA: Diagnosis not present

## 2023-02-26 DIAGNOSIS — E1140 Type 2 diabetes mellitus with diabetic neuropathy, unspecified: Secondary | ICD-10-CM | POA: Diagnosis not present

## 2023-02-26 DIAGNOSIS — E782 Mixed hyperlipidemia: Secondary | ICD-10-CM | POA: Diagnosis not present

## 2023-02-26 DIAGNOSIS — Z794 Long term (current) use of insulin: Secondary | ICD-10-CM | POA: Diagnosis not present

## 2023-03-29 DIAGNOSIS — E782 Mixed hyperlipidemia: Secondary | ICD-10-CM | POA: Diagnosis not present

## 2023-03-29 DIAGNOSIS — E1140 Type 2 diabetes mellitus with diabetic neuropathy, unspecified: Secondary | ICD-10-CM | POA: Diagnosis not present

## 2023-04-17 ENCOUNTER — Ambulatory Visit: Payer: Medicare HMO | Admitting: Nurse Practitioner

## 2023-04-24 ENCOUNTER — Encounter: Payer: Self-pay | Admitting: Podiatry

## 2023-04-24 ENCOUNTER — Ambulatory Visit (INDEPENDENT_AMBULATORY_CARE_PROVIDER_SITE_OTHER): Payer: Medicare HMO | Admitting: Podiatry

## 2023-04-24 VITALS — Ht 68.0 in | Wt 190.0 lb

## 2023-04-24 DIAGNOSIS — M79675 Pain in left toe(s): Secondary | ICD-10-CM

## 2023-04-24 DIAGNOSIS — M79674 Pain in right toe(s): Secondary | ICD-10-CM | POA: Diagnosis not present

## 2023-04-24 DIAGNOSIS — B351 Tinea unguium: Secondary | ICD-10-CM

## 2023-04-24 NOTE — Progress Notes (Signed)
  Subjective:  Patient ID: MCKENNON ZWART, male    DOB: 1955-11-12,  MRN: [id number]  Chief Complaint  Patient presents with   Nail Problem    Pt is here for [facility].    68 y.o. male returns with the above complaint. History confirmed with patient.  Nails are thickened elongated and painful again.  The callus was quite painful after his last visit and so he would not like it debrided today and it is not bothering him right now  Objective:  Physical Exam: warm, good capillary refill, no trophic changes or ulcerative lesions, normal DP and PT pulses, abnormal monofilament exam with loss of protective sensation and onychomycosis x10, he has calluses submet 5, submet 1 and medial hallux bilaterally.  He has compensated pes planus and metatarsalgia bilateral  Assessment:   1. Pain due to onychomycosis of toenails of both feet        Plan:  Patient was evaluated and treated and all questions answered.    Discussed the etiology and treatment options for the condition in detail with the patient.  Recommended debridement of the nails today. Sharp and mechanical debridement performed of all painful and mycotic nails today. Nails debrided in length and thickness using a nail nipper and a mechanical burr to level of comfort. Discussed treatment options including appropriate shoe gear. Follow up as needed for painful nails.    Return if symptoms worsen or fail to improve.

## 2023-05-07 NOTE — Patient Instructions (Signed)

## 2023-05-08 ENCOUNTER — Encounter: Payer: Self-pay | Admitting: Nurse Practitioner

## 2023-05-08 ENCOUNTER — Ambulatory Visit: Payer: Medicare HMO | Admitting: Nurse Practitioner

## 2023-05-08 VITALS — BP 108/74 | HR 84 | Ht 68.0 in | Wt 205.6 lb

## 2023-05-08 DIAGNOSIS — I10 Essential (primary) hypertension: Secondary | ICD-10-CM

## 2023-05-08 DIAGNOSIS — Z7984 Long term (current) use of oral hypoglycemic drugs: Secondary | ICD-10-CM | POA: Diagnosis not present

## 2023-05-08 DIAGNOSIS — E1165 Type 2 diabetes mellitus with hyperglycemia: Secondary | ICD-10-CM

## 2023-05-08 DIAGNOSIS — E559 Vitamin D deficiency, unspecified: Secondary | ICD-10-CM | POA: Diagnosis not present

## 2023-05-08 DIAGNOSIS — E782 Mixed hyperlipidemia: Secondary | ICD-10-CM

## 2023-05-08 LAB — POCT GLYCOSYLATED HEMOGLOBIN (HGB A1C): Hemoglobin A1C: 8.3 % — AB (ref 4.0–5.6)

## 2023-05-08 MED ORDER — NOVOLOG FLEXPEN 100 UNIT/ML ~~LOC~~ SOPN: 5.0000 [IU] | PEN_INJECTOR | Freq: Three times a day (TID) | SUBCUTANEOUS | 3 refills | Status: DC

## 2023-05-08 MED ORDER — PEN NEEDLES 31G X 6 MM MISC: 6 refills | Status: AC

## 2023-05-08 MED ORDER — GLIMEPIRIDE 4 MG PO TABS: 4.0000 mg | ORAL_TABLET | Freq: Two times a day (BID) | ORAL | 3 refills | Status: DC

## 2023-05-08 MED ORDER — LANTUS SOLOSTAR 100 UNIT/ML ~~LOC~~ SOPN: 38.0000 [IU] | PEN_INJECTOR | Freq: Every day | SUBCUTANEOUS | 3 refills | Status: DC

## 2023-05-08 NOTE — Progress Notes (Signed)
 Endocrinology Consult Note       05/08/2023, 12:53 PM   Subjective:    Patient ID: Garrett Clements, male    DOB: [birth year].  Garrett Clements is being seen in consultation for management of currently uncontrolled symptomatic diabetes requested by  Elfredia Nevins, MD.   Past Medical History:  Diagnosis Date   Anxiety    Aortic atherosclerosis (HCC)    Chronic atrial fibrillation [facility])    Diagnosed February 2016   Chronic back pain    Coronary artery calcification    Depression    Essential hypertension    GERD (gastroesophageal reflux disease)    H. pylori infection 07/2011   Hiatal hernia    Leukocytosis    Memory deficits    Migraines    Neuropathy    Sleep apnea    CPAP prescibed but "I don't use"   Tubulovillous adenoma of colon 05/23/10   Type 2 diabetes mellitus (HCC)     Past Surgical History:  Procedure Laterality Date   CHOLECYSTECTOMY  2006   COLONOSCOPY  05/15/10   Rourk-friable anal canal, 1cm pedunculated TVA  mid-sigmoid , diminutive adjacent polyp ablated   COLONOSCOPY WITH PROPOFOL N/A 09/02/2017   Procedure: COLONOSCOPY WITH PROPOFOL;  Surgeon: Corbin Ade, MD;  Location: [facility];  Service: Endoscopy;  Laterality: N/A;  8:30am   ESOPHAGOGASTRODUODENOSCOPY  07/20/2011   Dr. Jena Gauss- normal esophagus-s/p maloney dialation, small hiatal hernia, hpylori +   POLYPECTOMY  09/02/2017   Procedure: POLYPECTOMY;  Surgeon: Corbin Ade, MD;  Location: [facility];  Service: Endoscopy;;  colon   UMBILICAL HERNIA REPAIR  2006    Social History   Socioeconomic History   Marital status: Divorced    Spouse name: Not on file   Number of children: 2   Years of education: Not on file   Highest education level: Not on file  Occupational History   Occupation: Disabled    Employer: NOT EMPLOYED  Tobacco Use   Smoking status: Every Day    Current packs/day: 2.00    Average packs/day:  2.0 packs/day for 55.0 years (109.9 ttl pk-yrs)    Types: Cigarettes    Start date: 05/19/1968   Smokeless tobacco: Never  Vaping Use   Vaping status: Never Used  Substance and Sexual Activity   Alcohol use: No    Alcohol/week: 0.0 standard drinks of alcohol    Comment: quit; used to be heavy drinker but quit 15 years   Drug use: No   Sexual activity: Not Currently    Birth control/protection: None  Other Topics Concern   Not on file  Social History Narrative   Lives w/ mother   Social Drivers of Health   Financial Resource Strain: Not on file  Food Insecurity: Not on file  Transportation Needs: Not on file  Physical Activity: Not on file  Stress: Not on file  Social Connections: Unknown (04/24/2022)   Received [facility]   Social Network    Social Network: Not on file    Family History  Problem Relation Age of Onset   Diabetes Mother    Osteoporosis Father    Ulcers  Father    Prostate cancer Father    Diabetes Brother    Diabetes Brother    Cancer Maternal Grandfather    Colon cancer Neg Hx     Outpatient Encounter Medications as of 05/08/2023  Medication Sig   acetaminophen (TYLENOL) 325 MG tablet Take by mouth.   atorvastatin (LIPITOR) 20 MG tablet Take 20 mg by mouth every morning.   fluticasone (FLONASE) 50 MCG/ACT nasal spray Place 1 spray into both nostrils daily.   gabapentin (NEURONTIN) 800 MG tablet Take 800 mg by mouth 3 (three) times daily.   Insulin Pen Needle (PEN NEEDLES) 31G X 6 MM MISC Use to inject insulin 4 times daily   lisinopril (PRINIVIL,ZESTRIL) 5 MG tablet Take 5 mg by mouth every morning.   Multiple Vitamins-Minerals (PRESERVISION AREDS 2+MULTI VIT PO) TAKE (1) TABLET BY MOUTHATWICE DAILY.   XARELTO 20 MG TABS tablet TAKE 1 TABLET DAILY WITH SUPPER.   [DISCONTINUED] glimepiride (AMARYL) 4 MG tablet Take 8 mg by mouth daily.   [DISCONTINUED] LANTUS SOLOSTAR 100 UNIT/ML Solostar Pen Inject 38 Units into the skin at  bedtime.   [DISCONTINUED] NOVOLOG FLEXPEN 100 UNIT/ML FlexPen Inject 5-11 Units into the skin 3 (three) times daily with meals.   glimepiride (AMARYL) 4 MG tablet Take 1 tablet (4 mg total) by mouth 2 (two) times daily with a meal.   LANTUS SOLOSTAR 100 UNIT/ML Solostar Pen Inject 38 Units into the skin at bedtime.   NOVOLOG FLEXPEN 100 UNIT/ML FlexPen Inject 5-11 Units into the skin 3 (three) times daily with meals.   [DISCONTINUED] gabapentin (NEURONTIN) 300 MG capsule Take 300 mg by mouth 3 (three) times daily. (Patient not taking: Reported on 05/08/2023)   [DISCONTINUED] glipiZIDE (GLUCOTROL) 10 MG tablet Take 10 mg by mouth every morning. (Patient not taking: Reported on 05/08/2023)   [DISCONTINUED] metFORMIN (GLUCOPHAGE) 500 MG tablet TAKE 2 TABLETS BY MOUTH TWICE DAILY FOR 90 DAYS (Patient not taking: Reported on 05/08/2023)   [DISCONTINUED] metoprolol succinate (TOPROL XL) 25 MG 24 hr tablet Take 1 tablet (25 mg total) by mouth daily. (Patient not taking: Reported on 05/08/2023)   [DISCONTINUED] Multiple Vitamins-Minerals (CENTRUM SILVER 50+MEN) TABS Take 1 tablet by mouth at bedtime. (Patient not taking: Reported on 05/08/2023)   [DISCONTINUED] omeprazole (PRILOSEC) 40 MG capsule Take 40 mg by mouth daily. (Patient not taking: Reported on 05/08/2023)   [DISCONTINUED] pioglitazone (ACTOS) 30 MG tablet Take 30 mg by mouth daily. (Patient not taking: Reported on 05/08/2023)   [DISCONTINUED] sertraline (ZOLOFT) 50 MG tablet Take 50 mg by mouth daily.  (Patient not taking: Reported on 05/08/2023)   No facility-administered encounter medications on file as of 05/08/2023.    ALLERGIES: No Known Allergies  VACCINATION STATUS:  There is no immunization history on file for this patient.  Diabetes He presents for his initial diabetic visit. He has type 2 diabetes mellitus. Onset time: diagnosed at approx age of 31. His disease course has been fluctuating. Hypoglycemia symptoms include  nervousness/anxiousness, sweats and tremors. Associated symptoms include fatigue, foot paresthesias, polydipsia and polyuria. There are no hypoglycemic complications. Symptoms are stable. Diabetic complications include heart disease (Afib), nephropathy and peripheral neuropathy. Risk factors for coronary artery disease include diabetes mellitus, dyslipidemia, family history, male sex, obesity, hypertension, tobacco exposure and sedentary lifestyle. Current diabetic treatment includes oral agent (monotherapy) and intensive insulin program. He is compliant with treatment most of the time (hx of noncompliance per referral- daughter is now helping manage his health). His weight is fluctuating minimally.  He is following a generally unhealthy diet. When asked about meal planning, he reported none. He has not had a previous visit with a dietitian. He participates in exercise intermittently. (He presents today for his consultation, with no meter or logs to review.  His POCT A1c today is 8.3%, improving per daughter.  He does monitor glucose 4+ times per day.  He drinks mostly water, some SF soda, 1/2 sweet tea and milk.  He eats 2-3 meals per day and does snack between.  He does not engage in routine physical activity.  He is UTD on eye exam, does see podiatrist routinely.) An ACE inhibitor/angiotensin II receptor blocker is being taken. He sees a podiatrist.Eye exam is current.     Review of systems  Constitutional: + Minimally fluctuating body weight, current Body mass index is 31.26 kg/m., no fatigue, no subjective hyperthermia, no subjective hypothermia Eyes: no blurry vision, no xerophthalmia ENT: no sore throat, no nodules palpated in throat, no dysphagia/odynophagia, no hoarseness Cardiovascular: no chest pain, no shortness of breath, no palpitations, no leg swelling Respiratory: + chronic cough- Hx COPD, no shortness of breath Gastrointestinal: no nausea/vomiting/diarrhea Musculoskeletal: no  muscle/joint aches Skin: no rashes, no hyperemia Neurological: no tremors, + numbness/ tingling to BLE, no dizziness Psychiatric: no depression, no anxiety  Objective:     BP 108/74 (BP Location: Left Arm, Patient Position: Sitting, Cuff Size: Large)   Pulse 84   Ht 5\' 8"  (1.727 m)   Wt 205 lb 9.6 oz (93.3 kg)   BMI 31.26 kg/m   Wt Readings from Last 3 Encounters:  05/08/23 205 lb 9.6 oz (93.3 kg)  04/24/23 190 lb (86.2 kg)  09/02/17 190 lb (86.2 kg)     BP Readings from Last 3 Encounters:  05/08/23 108/74  12/19/22 138/83  09/02/17 (!) 79/59     Physical Exam- Limited  Constitutional:  Body mass index is 31.26 kg/m. , not in acute distress, normal state of mind Eyes:  EOMI, no exophthalmos Neck: Supple Cardiovascular: RRR, no murmurs, rubs, or gallops, no edema Respiratory: Adequate breathing efforts, no crackles, rales,+ rhonchi, no wheezing Musculoskeletal: no gross deformities, strength intact in all four extremities, no gross restriction of joint movements Skin:  no rashes, no hyperemia Neurological: no tremor with outstretched hands   Diabetic Foot Exam - Simple   No data filed      CMP ( most recent) CMP     Component Value Date/Time   NA 135 08/27/2017 1105   K 4.6 08/27/2017 1105   CL 98 08/27/2017 1105   CO2 29 08/27/2017 1105   GLUCOSE 214 (H) 08/27/2017 1105   BUN 15 12/20/2022 0000   CREATININE 1.0 12/20/2022 0000   CREATININE 0.89 08/27/2017 1105   CALCIUM 10.0 08/27/2017 1105   PROT 7.8 04/09/2017 0947   ALBUMIN 4.4 04/09/2017 0947   AST 19 04/09/2017 0947   ALT 17 04/09/2017 0947   ALKPHOS 61 04/09/2017 0947   BILITOT 1.2 04/09/2017 0947   EGFR 87 12/20/2022 0000   GFRNONAA >60 08/27/2017 1105     Diabetic Labs (most recent): Lab Results  Component Value Date   HGBA1C 8.3 (A) 05/08/2023     Lipid Panel ( most recent) Lipid Panel     Component Value Date/Time   TRIG 211 (A) 12/20/2022 0000   LDLCALC 73 12/20/2022 0000       Lab Results  Component Value Date   TSH 1.92 12/20/2022  Assessment & Plan:   1) Type 2 diabetes mellitus with hyperglycemia, without long-term current use of insulin (HCC) (Primary)  He presents today for his consultation, with no meter or logs to review.  His POCT A1c today is 8.3%, improving per daughter.  He does monitor glucose 4+ times per day.  He drinks mostly water, some SF soda, 1/2 sweet tea and milk.  He eats 2-3 meals per day and does snack between.  He does not engage in routine physical activity.  He is UTD on eye exam, does see podiatrist routinely.  - Garrett Clements has currently uncontrolled symptomatic type 2 DM since 68 years of age, with most recent A1c of 8.3 %.   -Recent labs reviewed.  - I had a long discussion with him about the progressive nature of diabetes and the pathology behind its complications. -his diabetes is complicated by mild CKD, neuropathy, smoking, and hx of noncompliance and he remains at a high risk for more acute and chronic complications which include CAD, CVA, CKD, retinopathy, and neuropathy. These are all discussed in detail with him.  The following Lifestyle Medicine recommendations according to American College of Lifestyle Medicine [facility]) were discussed and offered to patient and he agrees to start the journey:  A. Whole Foods, Plant-based plate comprising of fruits and vegetables, plant-based proteins, whole-grain carbohydrates was discussed in detail with the patient.   A list for source of those nutrients were also provided to the patient.  Patient will use only water or unsweetened tea for hydration. B.  The need to stay away from risky substances including alcohol, smoking; obtaining 7 to 9 hours of restorative sleep, at least 150 minutes of moderate intensity exercise weekly, the importance of healthy social connections,  and stress reduction techniques were discussed. C.  A full color page of  Calorie density of various  food groups per pound showing examples of each food groups was provided to the patient.  - I have counseled him on diet and weight management by adopting a carbohydrate restricted/protein rich diet. Patient is encouraged to switch to unprocessed or minimally processed complex starch and increased protein intake (animal or plant source), fruits, and vegetables. -  he is advised to stick to a routine mealtimes to eat 3 meals a day and avoid unnecessary snacks (to snack only to correct hypoglycemia).   - he acknowledges that there is a room for improvement in his food and drink choices. - Suggestion is made for him to avoid simple carbohydrates from his diet including Cakes, Sweet Desserts, Ice Cream, Soda (diet and regular), Sweet Tea, Candies, Chips, Cookies, Store Bought Juices, Alcohol in Excess of 1-2 drinks a day, Artificial Sweeteners, Coffee Creamer, and "Sugar-free" Products. This will help patient to have more stable blood glucose profile and potentially avoid unintended weight gain.  - I have approached him with the following individualized plan to manage his diabetes and patient agrees:   -He is advised to continue Lantus 38 units SQ nightly and adjust his Novolog to 5-11 units TID with meals if glucose is above 90 and he is eating (Specific instructions on how to titrate insulin dosage based on glucose readings given to patient in writing).  He and his daughter demonstrated proper understanding on how to dose mealtime insulin with the SSI chart with me today.   -he is encouraged to start/continue monitoring glucose 2 times daily, before breakfast and before bed, to log their readings on the clinic sheets provided, and bring them  to review at follow up appointment in 3 months.  He could greatly benefit from CGM device.  I sent in for Dexcom G7 to [employer] for fulfillment.  - Adjustment parameters are given to him for hypo and hyperglycemia in writing. - he is encouraged to call clinic for  blood glucose levels less than 70 or above 300 mg /dl. - he is advised to continue Glimepiride 4 mg po twice daily, therapeutically suitable for patient .  - he is not an ideal candidate for incretin therapy given his history of heavy smoking, which would increase his risk of pancreatitis on GLP1 hormone.  - Specific targets for  A1c; LDL, HDL, and Triglycerides were discussed with the patient.  2) Blood Pressure /Hypertension:  his blood pressure is controlled to target.   he is advised to continue his current medications as prescribed by his PCP.  3) Lipids/Hyperlipidemia:    Review of his recent lipid panel from 12/20/22 showed controlled LDL at 73 and elevated triglycerides of 211 .  he is advised to continue Atorvastatin 20 mg daily at bedtime.  Side effects and precautions discussed with him.  4)  Weight/Diet:  his Body mass index is 31.26 kg/m.  -  clearly complicating his diabetes care.   he is a candidate for weight loss. I discussed with him the fact that loss of 5 - 10% of his  current body weight will have the most impact on his diabetes management.  Exercise, and detailed carbohydrates information provided  -  detailed on discharge instructions.  5) Chronic Care/Health Maintenance: -he is on ACEI/ARB and Statin medications and is encouraged to initiate and continue to follow up with Ophthalmology, Dentist, Podiatrist at least yearly or according to recommendations, and advised to QUIT SMOKING. I have recommended yearly flu vaccine and pneumonia vaccine at least every 5 years; moderate intensity exercise for up to 150 minutes weekly; and sleep for at least 7 hours a day.  - he is advised to maintain close follow up with Elfredia Nevins, MD for primary care needs, as well as his other providers for optimal and coordinated care.   - Time spent in this patient care: 60 min, of which > 50% was spent in counseling him about his diabetes and the rest reviewing his blood glucose logs,  discussing his hypoglycemia and hyperglycemia episodes, reviewing his current and previous labs/studies (including abstraction from other facilities) and medications doses and developing a long term treatment plan based on the latest standards of care/guidelines; and documenting his care.    Please refer to Patient Instructions for Blood Glucose Monitoring and Insulin/Medications Dosing Guide" in media tab for additional information. Please also refer to "Patient Self Inventory" in the Media tab for reviewed elements of pertinent patient history.  Garrett Clements participated in the discussions, expressed understanding, and voiced agreement with the above plans.  All questions were answered to his satisfaction. he is encouraged to contact clinic should he have any questions or concerns prior to his return visit.     Follow up plan: - Return in about 3 months (around 08/08/2023) for Diabetes F/U with A1c in office, No previsit labs, Bring meter and logs.    Ronny Bacon, [facility] [address] [place], [place] [address] Phone: [phone] Fax: [phone]  05/08/2023, 12:53 PM

## 2023-05-13 DIAGNOSIS — E1165 Type 2 diabetes mellitus with hyperglycemia: Secondary | ICD-10-CM | POA: Diagnosis not present

## 2023-06-13 DIAGNOSIS — E1165 Type 2 diabetes mellitus with hyperglycemia: Secondary | ICD-10-CM | POA: Diagnosis not present

## 2023-07-13 DIAGNOSIS — E1165 Type 2 diabetes mellitus with hyperglycemia: Secondary | ICD-10-CM | POA: Diagnosis not present

## 2023-07-24 ENCOUNTER — Telehealth: Payer: Self-pay | Admitting: Nurse Practitioner

## 2023-07-24 NOTE — Telephone Encounter (Signed)
 Pt stated that they needed more dexcom G7 sensors called into [facility], Pt states that the last sshipment came fedex.

## 2023-07-24 NOTE — Telephone Encounter (Signed)
 Looks like he has been getting them from [employer].  I did send a message through [place] that he is ready for refill.

## 2023-08-13 DIAGNOSIS — E1165 Type 2 diabetes mellitus with hyperglycemia: Secondary | ICD-10-CM | POA: Diagnosis not present

## 2023-08-21 ENCOUNTER — Ambulatory Visit: Admitting: Nurse Practitioner

## 2023-08-21 ENCOUNTER — Encounter: Payer: Self-pay | Admitting: Nurse Practitioner

## 2023-08-21 VITALS — BP 110/74 | HR 86 | Ht 68.0 in | Wt 196.2 lb

## 2023-08-21 DIAGNOSIS — Z7984 Long term (current) use of oral hypoglycemic drugs: Secondary | ICD-10-CM | POA: Diagnosis not present

## 2023-08-21 DIAGNOSIS — E559 Vitamin D deficiency, unspecified: Secondary | ICD-10-CM | POA: Diagnosis not present

## 2023-08-21 DIAGNOSIS — I10 Essential (primary) hypertension: Secondary | ICD-10-CM | POA: Diagnosis not present

## 2023-08-21 DIAGNOSIS — E782 Mixed hyperlipidemia: Secondary | ICD-10-CM

## 2023-08-21 DIAGNOSIS — E1165 Type 2 diabetes mellitus with hyperglycemia: Secondary | ICD-10-CM | POA: Diagnosis not present

## 2023-08-21 LAB — POCT GLYCOSYLATED HEMOGLOBIN (HGB A1C): Hemoglobin A1C: 8.6 % — AB (ref 4.0–5.6)

## 2023-08-21 MED ORDER — GLIMEPIRIDE 4 MG PO TABS: 4.0000 mg | ORAL_TABLET | Freq: Every day | ORAL | Status: DC

## 2023-08-21 NOTE — Progress Notes (Signed)
 Endocrinology Follow Up Note       08/21/2023, 9:08 AM   Subjective:    Patient ID: Garrett Clements, male    DOB: Dec 12, 1955.  Garrett Clements is being seen in follow up after being seen in consultation for management of currently uncontrolled symptomatic diabetes requested by  Bertell Satterfield, MD.   Past Medical History:  Diagnosis Date   Anxiety    Aortic atherosclerosis (HCC)    Chronic atrial fibrillation [facility])    Diagnosed February 2016   Chronic back pain    Coronary artery calcification    Depression    Essential hypertension    GERD (gastroesophageal reflux disease)    H. pylori infection 07/2011   Hiatal hernia    Leukocytosis    Memory deficits    Migraines    Neuropathy    Sleep apnea    CPAP prescibed but I don't use   Tubulovillous adenoma of colon 05/23/10   Type 2 diabetes mellitus (HCC)     Past Surgical History:  Procedure Laterality Date   CHOLECYSTECTOMY  2006   COLONOSCOPY  05/15/10   Rourk-friable anal canal, 1cm pedunculated TVA  mid-sigmoid , diminutive adjacent polyp ablated   COLONOSCOPY WITH PROPOFOL  N/A 09/02/2017   Procedure: COLONOSCOPY WITH PROPOFOL ;  Surgeon: Shaaron Lamar HERO, MD;  Location: [facility];  Service: Endoscopy;  Laterality: N/A;  8:30am   ESOPHAGOGASTRODUODENOSCOPY  07/20/2011   Dr. Shaaron- normal esophagus-s/p maloney dialation, small hiatal hernia, hpylori +   POLYPECTOMY  09/02/2017   Procedure: POLYPECTOMY;  Surgeon: Shaaron Lamar HERO, MD;  Location: [facility];  Service: Endoscopy;;  colon   UMBILICAL HERNIA REPAIR  2006    Social History   Socioeconomic History   Marital status: Divorced    Spouse name: Not on file   Number of children: 2   Years of education: Not on file   Highest education level: Not on file  Occupational History   Occupation: Disabled    Employer: NOT EMPLOYED  Tobacco Use   Smoking status: Every Day    Current packs/day:  2.00    Average packs/day: 2.0 packs/day for 55.3 years (110.5 ttl pk-yrs)    Types: Cigarettes    Start date: 05/19/1968   Smokeless tobacco: Never  Vaping Use   Vaping status: Never Used  Substance and Sexual Activity   Alcohol use: No    Alcohol/week: 0.0 standard drinks of alcohol    Comment: quit; used to be heavy drinker but quit 15 years   Drug use: No   Sexual activity: Not Currently    Birth control/protection: None  Other Topics Concern   Not on file  Social History Narrative   Lives w/ mother   Social Drivers of Health   Financial Resource Strain: Not on file  Food Insecurity: Not on file  Transportation Needs: Not on file  Physical Activity: Not on file  Stress: Not on file  Social Connections: Unknown (04/24/2022)   Received from [facility]   Social Network    Social Network: Not on file    Family History  Problem Relation Age of Onset   Diabetes Mother    Osteoporosis  Father    Ulcers Father    Prostate cancer Father    Diabetes Brother    Diabetes Brother    Cancer Maternal Grandfather    Colon cancer Neg Hx     Outpatient Encounter Medications as of 08/21/2023  Medication Sig   acetaminophen  (TYLENOL ) 325 MG tablet Take by mouth.   atorvastatin (LIPITOR) 20 MG tablet Take 20 mg by mouth every morning.   fluticasone (FLONASE) 50 MCG/ACT nasal spray Place 1 spray into both nostrils daily.   gabapentin (NEURONTIN) 800 MG tablet Take 800 mg by mouth 3 (three) times daily.   Insulin Pen Needle (PEN NEEDLES) 31G X 6 MM MISC Use to inject insulin 4 times daily   LANTUS  SOLOSTAR 100 UNIT/ML Solostar Pen Inject 38 Units into the skin at bedtime.   lisinopril (PRINIVIL,ZESTRIL) 5 MG tablet Take 5 mg by mouth every morning.   Multiple Vitamins-Minerals (PRESERVISION AREDS 2+MULTI VIT PO) TAKE (1) TABLET BY MOUTHATWICE DAILY.   NOVOLOG  FLEXPEN 100 UNIT/ML FlexPen Inject 5-11 Units into the skin 3 (three) times daily with meals.   XARELTO  20 MG TABS tablet  TAKE 1 TABLET DAILY WITH SUPPER.   [DISCONTINUED] glimepiride  (AMARYL ) 4 MG tablet Take 1 tablet (4 mg total) by mouth 2 (two) times daily with a meal.   glimepiride  (AMARYL ) 4 MG tablet Take 1 tablet (4 mg total) by mouth daily with breakfast.   No facility-administered encounter medications on file as of 08/21/2023.    ALLERGIES: No Known Allergies  VACCINATION STATUS:  There is no immunization history on file for this patient.  Diabetes He presents for his follow-up diabetic visit. He has type 2 diabetes mellitus. Onset time: diagnosed at approx age of 69. His disease course has been improving. Hypoglycemia symptoms include sweats and tremors. Associated symptoms include fatigue, foot paresthesias, polydipsia and polyuria. There are no hypoglycemic complications. Symptoms are improving. Diabetic complications include heart disease (Afib), nephropathy and peripheral neuropathy. Risk factors for coronary artery disease include diabetes mellitus, dyslipidemia, family history, male sex, obesity, hypertension, tobacco exposure and sedentary lifestyle. Current diabetic treatment includes oral agent (monotherapy) and intensive insulin program. He is compliant with treatment most of the time. His weight is fluctuating minimally. He is following a generally healthy diet. When asked about meal planning, he reported none. He has not had a previous visit with a dietitian. He participates in exercise intermittently. His home blood glucose trend is decreasing steadily. (He presents today, accompanied by his daughter, with his CGM showing improving glycemic profile overall.  His POCT A1c today is 8.6%, increasing from last visit of 8.3%.  Analysis of his CGM shows TIR 43%, TAR 57%, TBR <1% with a GMI of 7.7%.  He notes when he has a spike in glucose, he will drink too much water  and get sick causing hypoglycemia.) An ACE inhibitor/angiotensin II receptor blocker is being taken. He sees a podiatrist.Eye exam is  current.     Review of systems  Constitutional: + decreasing body weight, current Body mass index is 29.83 kg/m., no fatigue, no subjective hyperthermia, no subjective hypothermia Eyes: no blurry vision, no xerophthalmia ENT: no sore throat, no nodules palpated in throat, no dysphagia/odynophagia, no hoarseness Cardiovascular: no chest pain, no shortness of breath, no palpitations, no leg swelling Respiratory: + chronic cough- Hx COPD, no shortness of breath Gastrointestinal: no nausea/vomiting/diarrhea Musculoskeletal: no muscle/joint aches Skin: no rashes, no hyperemia Neurological: no tremors, + numbness/ tingling to BLE, no dizziness Psychiatric: no depression, no anxiety  Objective:     BP 110/74 (BP Location: Left Arm, Patient Position: Sitting, Cuff Size: Large)   Pulse 86   Ht 5' 8 (1.727 m)   Wt 196 lb 3.2 oz (89 kg)   BMI 29.83 kg/m   Wt Readings from Last 3 Encounters:  08/21/23 196 lb 3.2 oz (89 kg)  05/08/23 205 lb 9.6 oz (93.3 kg)  04/24/23 190 lb (86.2 kg)     BP Readings from Last 3 Encounters:  08/21/23 110/74  05/08/23 108/74  12/19/22 138/83     Physical Exam- Limited  Constitutional:  Body mass index is 29.83 kg/m. , not in acute distress, normal state of mind Eyes:  EOMI, no exophthalmos Musculoskeletal: no gross deformities, strength intact in all four extremities, no gross restriction of joint movements Skin:  no rashes, no hyperemia Neurological: no tremor with outstretched hands   Diabetic Foot Exam - Simple   No data filed      CMP ( most recent) CMP     Component Value Date/Time   NA 135 08/27/2017 1105   K 4.6 08/27/2017 1105   CL 98 08/27/2017 1105   CO2 29 08/27/2017 1105   GLUCOSE 214 (H) 08/27/2017 1105   BUN 15 12/20/2022 0000   CREATININE 1.0 12/20/2022 0000   CREATININE 0.89 08/27/2017 1105   CALCIUM 10.0 08/27/2017 1105   PROT 7.8 04/09/2017 0947   ALBUMIN 4.4 04/09/2017 0947   AST 19 04/09/2017 0947   ALT 17  04/09/2017 0947   ALKPHOS 61 04/09/2017 0947   BILITOT 1.2 04/09/2017 0947   EGFR 87 12/20/2022 0000   GFRNONAA >60 08/27/2017 1105     Diabetic Labs (most recent): Lab Results  Component Value Date   HGBA1C 8.6 (A) 08/21/2023   HGBA1C 8.3 (A) 05/08/2023     Lipid Panel ( most recent) Lipid Panel     Component Value Date/Time   TRIG 211 (A) 12/20/2022 0000   LDLCALC 73 12/20/2022 0000      Lab Results  Component Value Date   TSH 1.92 12/20/2022           Assessment & Plan:   1) Type 2 diabetes mellitus with hyperglycemia, without long-term current use of insulin (HCC) (Primary)  He presents today, accompanied by his daughter, with his CGM showing improving glycemic profile overall.  His POCT A1c today is 8.6%, increasing from last visit of 8.3%.  Analysis of his CGM shows TIR 43%, TAR 57%, TBR <1% with a GMI of 7.7%.  He notes when he has a spike in glucose, he will drink too much water  and get sick causing hypoglycemia.  - Garrett Clements has currently uncontrolled symptomatic type 2 DM since 68 years of age.   -Recent labs reviewed.  - I had a long discussion with him about the progressive nature of diabetes and the pathology behind its complications. -his diabetes is complicated by mild CKD, neuropathy, smoking, and hx of noncompliance and he remains at a high risk for more acute and chronic complications which include CAD, CVA, CKD, retinopathy, and neuropathy. These are all discussed in detail with him.  The following Lifestyle Medicine recommendations according to American College of Lifestyle Medicine [facility]) were discussed and offered to patient and he agrees to start the journey:  A. Whole Foods, Plant-based plate comprising of fruits and vegetables, plant-based proteins, whole-grain carbohydrates was discussed in detail with the patient.   A list for source of those nutrients were also provided to the  patient.  Patient will use only water  or unsweetened tea for  hydration. B.  The need to stay away from risky substances including alcohol, smoking; obtaining 7 to 9 hours of restorative sleep, at least 150 minutes of moderate intensity exercise weekly, the importance of healthy social connections,  and stress reduction techniques were discussed. C.  A full color page of  Calorie density of various food groups per pound showing examples of each food groups was provided to the patient.  - Nutritional counseling repeated at each appointment due to patients tendency to fall back in to old habits.  - The patient admits there is a room for improvement in their diet and drink choices. -  Suggestion is made for the patient to avoid simple carbohydrates from their diet including Cakes, Sweet Desserts / Pastries, Ice Cream, Soda (diet and regular), Sweet Tea, Candies, Chips, Cookies, Sweet Pastries, Store Bought Juices, Alcohol in Excess of 1-2 drinks a day, Artificial Sweeteners, Coffee Creamer, and Sugar-free Products. This will help patient to have stable blood glucose profile and potentially avoid unintended weight gain.   - I encouraged the patient to switch to unprocessed or minimally processed complex starch and increased protein intake (animal or plant source), fruits, and vegetables.   - Patient is advised to stick to a routine mealtimes to eat 3 meals a day and avoid unnecessary snacks (to snack only to correct hypoglycemia).  - I have approached him with the following individualized plan to manage his diabetes and patient agrees:   -He is advised to continue Lantus  38 units SQ nightly and Novolog  to 5-11 units TID with meals if glucose is above 90 and he is eating (Specific instructions on how to titrate insulin dosage based on glucose readings given to patient in writing).  He is to lower his Glimepiride  to 4 mg po daily at breakfast (not BID).  -he is encouraged to continue monitoring glucose 2 times daily (using CGM), before breakfast and before bed,  and to call the clinic if he has readings less than 70 or above 300 for 3 tests in a row.  - Adjustment parameters are given to him for hypo and hyperglycemia in writing.  - he is not an ideal candidate for incretin therapy given his history of heavy smoking, which would increase his risk of pancreatitis on GLP1 hormone.  - Specific targets for  A1c; LDL, HDL, and Triglycerides were discussed with the patient.  2) Blood Pressure /Hypertension:  his blood pressure is controlled to target.   he is advised to continue his current medications as prescribed by his PCP.  3) Lipids/Hyperlipidemia:    Review of his recent lipid panel from 12/20/22 showed controlled LDL at 73 and elevated triglycerides of 211 .  he is advised to continue Atorvastatin 20 mg daily at bedtime.  Side effects and precautions discussed with him.  Will recheck lipid panel prior to next visit.  4)  Weight/Diet:  his Body mass index is 29.83 kg/m.  -  clearly complicating his diabetes care.   he is a candidate for weight loss. I discussed with him the fact that loss of 5 - 10% of his  current body weight will have the most impact on his diabetes management.  Exercise, and detailed carbohydrates information provided  -  detailed on discharge instructions.  5) Chronic Care/Health Maintenance: -he is on ACEI/ARB and Statin medications and is encouraged to initiate and continue to follow up with Ophthalmology, Dentist, Podiatrist at least yearly  or according to recommendations, and advised to QUIT SMOKING. I have recommended yearly flu vaccine and pneumonia vaccine at least every 5 years; moderate intensity exercise for up to 150 minutes weekly; and sleep for at least 7 hours a day.  - he is advised to maintain close follow up with Bertell Satterfield, MD for primary care needs, as well as his other providers for optimal and coordinated care.     I spent  28  minutes in the care of the patient today including review of labs from  CMP, Lipids, Thyroid  Function, Hematology (current and previous including abstractions from other facilities); face-to-face time discussing  his blood glucose readings/logs, discussing hypoglycemia and hyperglycemia episodes and symptoms, medications doses, his options of short and long term treatment based on the latest standards of care / guidelines;  discussion about incorporating lifestyle medicine;  and documenting the encounter. Risk reduction counseling performed per USPSTF guidelines to reduce obesity and cardiovascular risk factors.     Please refer to Patient Instructions for Blood Glucose Monitoring and Insulin/Medications Dosing Guide  in media tab for additional information. Please  also refer to  Patient Self Inventory in the Media  tab for reviewed elements of pertinent patient history.  Garrett Clements participated in the discussions, expressed understanding, and voiced agreement with the above plans.  All questions were answered to his satisfaction. he is encouraged to contact clinic should he have any questions or concerns prior to his return visit.     Follow up plan: - Return in about 4 months (around 12/21/2023) for Diabetes F/U with A1c in office, Previsit labs, Bring meter and logs.   Benton Rio, [facility] [address] [place], [place] [address] Phone: [phone] Fax: [phone]  08/21/2023, 9:08 AM

## 2023-09-12 DIAGNOSIS — E1165 Type 2 diabetes mellitus with hyperglycemia: Secondary | ICD-10-CM | POA: Diagnosis not present

## 2023-09-18 DIAGNOSIS — Z9229 Personal history of other drug therapy: Secondary | ICD-10-CM | POA: Diagnosis not present

## 2023-09-18 DIAGNOSIS — I10 Essential (primary) hypertension: Secondary | ICD-10-CM | POA: Diagnosis not present

## 2023-09-18 DIAGNOSIS — J449 Chronic obstructive pulmonary disease, unspecified: Secondary | ICD-10-CM | POA: Diagnosis not present

## 2023-09-18 DIAGNOSIS — Z794 Long term (current) use of insulin: Secondary | ICD-10-CM | POA: Diagnosis not present

## 2023-09-18 DIAGNOSIS — E1140 Type 2 diabetes mellitus with diabetic neuropathy, unspecified: Secondary | ICD-10-CM | POA: Diagnosis not present

## 2023-09-18 DIAGNOSIS — Z6828 Body mass index (BMI) 28.0-28.9, adult: Secondary | ICD-10-CM | POA: Diagnosis not present

## 2023-09-18 DIAGNOSIS — E663 Overweight: Secondary | ICD-10-CM | POA: Diagnosis not present

## 2023-09-18 DIAGNOSIS — E1165 Type 2 diabetes mellitus with hyperglycemia: Secondary | ICD-10-CM | POA: Diagnosis not present

## 2023-09-18 DIAGNOSIS — I4891 Unspecified atrial fibrillation: Secondary | ICD-10-CM | POA: Diagnosis not present

## 2023-10-13 DIAGNOSIS — E1165 Type 2 diabetes mellitus with hyperglycemia: Secondary | ICD-10-CM | POA: Diagnosis not present

## 2023-11-13 DIAGNOSIS — E1165 Type 2 diabetes mellitus with hyperglycemia: Secondary | ICD-10-CM | POA: Diagnosis not present

## 2023-12-13 DIAGNOSIS — E1165 Type 2 diabetes mellitus with hyperglycemia: Secondary | ICD-10-CM | POA: Diagnosis not present

## 2023-12-25 ENCOUNTER — Ambulatory Visit: Admitting: Nurse Practitioner

## 2024-01-13 DIAGNOSIS — E1165 Type 2 diabetes mellitus with hyperglycemia: Secondary | ICD-10-CM | POA: Diagnosis not present

## 2024-01-15 ENCOUNTER — Ambulatory Visit (INDEPENDENT_AMBULATORY_CARE_PROVIDER_SITE_OTHER)

## 2024-01-15 ENCOUNTER — Ambulatory Visit: Admitting: Podiatry

## 2024-01-15 VITALS — Ht 68.0 in | Wt 196.2 lb

## 2024-01-15 DIAGNOSIS — M79675 Pain in left toe(s): Secondary | ICD-10-CM

## 2024-01-15 DIAGNOSIS — E1142 Type 2 diabetes mellitus with diabetic polyneuropathy: Secondary | ICD-10-CM | POA: Diagnosis not present

## 2024-01-15 DIAGNOSIS — M79671 Pain in right foot: Secondary | ICD-10-CM

## 2024-01-15 DIAGNOSIS — M79672 Pain in left foot: Secondary | ICD-10-CM | POA: Diagnosis not present

## 2024-01-15 DIAGNOSIS — M216X2 Other acquired deformities of left foot: Secondary | ICD-10-CM | POA: Diagnosis not present

## 2024-01-15 DIAGNOSIS — M216X1 Other acquired deformities of right foot: Secondary | ICD-10-CM

## 2024-01-15 DIAGNOSIS — M79674 Pain in right toe(s): Secondary | ICD-10-CM | POA: Diagnosis not present

## 2024-01-15 DIAGNOSIS — B351 Tinea unguium: Secondary | ICD-10-CM

## 2024-01-15 DIAGNOSIS — L84 Corns and callosities: Secondary | ICD-10-CM

## 2024-01-16 NOTE — Progress Notes (Signed)
  Subjective:  Patient ID: Garrett Clements, male    DOB: 1955-12-19,  MRN: [id number]  Chief Complaint  Patient presents with   Diabetes    RM 2 [facility] and bilateral callus trim.    68 y.o. male returns with the above complaint. History confirmed with patient.  Nails are thickened elongated and painful again.  Calluses are painful as well.  Still dealing with pain on the ball of the foot that is quite significant for him  Objective:  Physical Exam: warm, good capillary refill, no active r ulcerative lesions, normal DP and PT pulses, abnormal monofilament exam with loss of protective sensation and onychomycosis x10, he has calluses submet 5, submet 1 and medial hallux bilaterally.  He has compensated pes planus and metatarsalgia bilateral   Radiographs taken today of both feet shows metatarsus adductus and compensated forefoot varus, there is no fracture or stress fracture  Assessment:   1. Foot pain, bilateral   2. Type 2 diabetes mellitus with diabetic polyneuropathy, without long-term current use of insulin (HCC)   3. Pronation of left foot   4. Pronation of right foot   5. Callus of foot   6. Pain due to onychomycosis of toenails of both feet        Plan:  Patient was evaluated and treated and all questions answered.    Discussed the etiology and treatment options for the condition in detail with the patient.  Recommended debridement of the nails today. Sharp and mechanical debridement performed of all painful and mycotic nails today. Nails debrided in length and thickness using a nail nipper and a mechanical burr to level of comfort. Discussed treatment options including appropriate shoe gear. Follow up as needed for painful nails.  All symptomatic hyperkeratoses were safely debrided with a sterile #15 blade to patient's level of comfort without incident. We discussed preventative and palliative care of these lesions including supportive and accommodative shoegear, padding,  prefabricated and custom molded accommodative orthoses, use of a pumice stone and lotions/creams daily.  We reviewed his radiographs and discussed his pain he is continuing to have.  We discussed the presence of the metatarsal adductus contributing to his metatarsalgia.  We discussed further treatment of this.  Not think he would be a good surgical candidate with his overall health, discussed that a custom molded insole and extra-depth diabetic shoe would help to provide stability support reduce his chance of pressure distribution across the ball of foot and ulceration risk.  He will be scheduled for this at [facility] medical supply.  Return if symptoms worsen or fail to improve.

## 2024-02-10 ENCOUNTER — Telehealth: Payer: Self-pay | Admitting: Nurse Practitioner

## 2024-02-10 DIAGNOSIS — I10 Essential (primary) hypertension: Secondary | ICD-10-CM

## 2024-02-10 DIAGNOSIS — E559 Vitamin D deficiency, unspecified: Secondary | ICD-10-CM

## 2024-02-10 DIAGNOSIS — Z7984 Long term (current) use of oral hypoglycemic drugs: Secondary | ICD-10-CM

## 2024-02-10 DIAGNOSIS — E782 Mixed hyperlipidemia: Secondary | ICD-10-CM

## 2024-02-10 DIAGNOSIS — E1165 Type 2 diabetes mellitus with hyperglycemia: Secondary | ICD-10-CM

## 2024-02-10 NOTE — Telephone Encounter (Signed)
New labs placed.

## 2024-02-10 NOTE — Telephone Encounter (Signed)
Update labs please

## 2024-02-12 DIAGNOSIS — E1165 Type 2 diabetes mellitus with hyperglycemia: Secondary | ICD-10-CM | POA: Diagnosis not present

## 2024-02-12 DIAGNOSIS — E559 Vitamin D deficiency, unspecified: Secondary | ICD-10-CM | POA: Diagnosis not present

## 2024-02-12 DIAGNOSIS — I10 Essential (primary) hypertension: Secondary | ICD-10-CM | POA: Diagnosis not present

## 2024-02-12 DIAGNOSIS — Z7984 Long term (current) use of oral hypoglycemic drugs: Secondary | ICD-10-CM | POA: Diagnosis not present

## 2024-02-12 DIAGNOSIS — E782 Mixed hyperlipidemia: Secondary | ICD-10-CM | POA: Diagnosis not present

## 2024-02-13 LAB — COMPREHENSIVE METABOLIC PANEL WITH GFR
ALT: 20 IU/L (ref 0–44)
AST: 17 IU/L (ref 0–40)
Albumin: 4.4 g/dL (ref 3.9–4.9)
Alkaline Phosphatase: 85 IU/L (ref 47–123)
BUN/Creatinine Ratio: 18 (ref 10–24)
BUN: 17 mg/dL (ref 8–27)
Bilirubin Total: 1.2 mg/dL (ref 0.0–1.2)
CO2: 25 mmol/L (ref 20–29)
Calcium: 9.5 mg/dL (ref 8.6–10.2)
Chloride: 100 mmol/L (ref 96–106)
Creatinine, Ser: 0.95 mg/dL (ref 0.76–1.27)
Globulin, Total: 2.8 g/dL (ref 1.5–4.5)
Glucose: 181 mg/dL — ABNORMAL HIGH (ref 70–99)
Potassium: 4.4 mmol/L (ref 3.5–5.2)
Sodium: 138 mmol/L (ref 134–144)
Total Protein: 7.2 g/dL (ref 6.0–8.5)
eGFR: 87 mL/min/1.73 (ref >59)

## 2024-02-13 LAB — VITAMIN D 25 HYDROXY (VIT D DEFICIENCY, FRACTURES): Vit D, 25-Hydroxy: 36.7 ng/mL (ref 30.0–100.0)

## 2024-02-13 LAB — LIPID PANEL
Chol/HDL Ratio: 2.9 ratio (ref 0.0–5.0)
Cholesterol, Total: 109 mg/dL (ref 100–199)
HDL: 38 mg/dL — ABNORMAL LOW (ref >39)
LDL Chol Calc (NIH): 56 mg/dL (ref 0–99)
Triglycerides: 72 mg/dL (ref 0–149)
VLDL Cholesterol Cal: 15 mg/dL (ref 5–40)

## 2024-02-13 LAB — T4, FREE: Free T4: 0.98 ng/dL (ref 0.82–1.77)

## 2024-02-13 LAB — TSH: TSH: 3.92 u[IU]/mL (ref 0.450–4.500)

## 2024-02-26 ENCOUNTER — Encounter: Payer: Self-pay | Admitting: Nurse Practitioner

## 2024-02-26 ENCOUNTER — Ambulatory Visit: Admitting: Nurse Practitioner

## 2024-02-26 VITALS — BP 112/74 | HR 83 | Ht 68.0 in | Wt 188.4 lb

## 2024-02-26 DIAGNOSIS — E1165 Type 2 diabetes mellitus with hyperglycemia: Secondary | ICD-10-CM | POA: Diagnosis not present

## 2024-02-26 DIAGNOSIS — Z7984 Long term (current) use of oral hypoglycemic drugs: Secondary | ICD-10-CM | POA: Diagnosis not present

## 2024-02-26 DIAGNOSIS — I10 Essential (primary) hypertension: Secondary | ICD-10-CM | POA: Diagnosis not present

## 2024-02-26 DIAGNOSIS — E782 Mixed hyperlipidemia: Secondary | ICD-10-CM

## 2024-02-26 DIAGNOSIS — E559 Vitamin D deficiency, unspecified: Secondary | ICD-10-CM | POA: Diagnosis not present

## 2024-02-26 LAB — POCT GLYCOSYLATED HEMOGLOBIN (HGB A1C): Hemoglobin A1C: 9.4 % — AB (ref 4.0–5.6)

## 2024-02-26 MED ORDER — NOVOLOG FLEXPEN 100 UNIT/ML ~~LOC~~ SOPN: 5.0000 [IU] | PEN_INJECTOR | Freq: Three times a day (TID) | SUBCUTANEOUS | 3 refills | Status: AC

## 2024-02-26 MED ORDER — LANTUS SOLOSTAR 100 UNIT/ML ~~LOC~~ SOPN: 35.0000 [IU] | PEN_INJECTOR | Freq: Every day | SUBCUTANEOUS | 3 refills | Status: AC

## 2024-02-26 NOTE — Progress Notes (Signed)
 "                                                                        Endocrinology Follow Up Note       02/26/2024, 11:26 AM   Subjective:    Patient ID: Garrett Clements, male    DOB: 04/25/1955.  Garrett Clements is being seen in follow up after being seen in consultation for management of currently uncontrolled symptomatic diabetes requested by  Patient, No Pcp Per.   Past Medical History:  Diagnosis Date   Anxiety    Aortic atherosclerosis    Chronic atrial fibrillation [facility])    Diagnosed February 2016   Chronic back pain    Coronary artery calcification    Depression    Essential hypertension    GERD (gastroesophageal reflux disease)    H. pylori infection 07/2011   Hiatal hernia    Leukocytosis    Memory deficits    Migraines    Neuropathy    Sleep apnea    CPAP prescibed but I don't use   Tubulovillous adenoma of colon 05/23/10   Type 2 diabetes mellitus (HCC)     Past Surgical History:  Procedure Laterality Date   CHOLECYSTECTOMY  2006   COLONOSCOPY  05/15/10   Rourk-friable anal canal, 1cm pedunculated TVA  mid-sigmoid , diminutive adjacent polyp ablated   COLONOSCOPY WITH PROPOFOL  N/A 09/02/2017   Procedure: COLONOSCOPY WITH PROPOFOL ;  Surgeon: Shaaron Lamar HERO, MD;  Location: [facility];  Service: Endoscopy;  Laterality: N/A;  8:30am   ESOPHAGOGASTRODUODENOSCOPY  07/20/2011   Dr. Shaaron- normal esophagus-s/p maloney dialation, small hiatal hernia, hpylori +   POLYPECTOMY  09/02/2017   Procedure: POLYPECTOMY;  Surgeon: Shaaron Lamar HERO, MD;  Location: [facility];  Service: Endoscopy;;  colon   UMBILICAL HERNIA REPAIR  2006    Social History   Socioeconomic History   Marital status: Divorced    Spouse name: Not on file   Number of children: 2   Years of education: Not on file   Highest education level: Not on file  Occupational History   Occupation: Disabled    Employer: NOT EMPLOYED  Tobacco Use   Smoking status: Every Day    Current packs/day:  2.00    Average packs/day: 2.0 packs/day for 55.8 years (111.5 ttl pk-yrs)    Types: Cigarettes    Start date: 05/19/1968   Smokeless tobacco: Never  Vaping Use   Vaping status: Never Used  Substance and Sexual Activity   Alcohol use: No    Alcohol/week: 0.0 standard drinks of alcohol    Comment: quit; used to be heavy drinker but quit 15 years   Drug use: No   Sexual activity: Not Currently    Birth control/protection: None  Other Topics Concern   Not on file  Social History Narrative   Lives w/ mother   Social Drivers of Health   Tobacco Use: High Risk (02/26/2024)   Patient History    Smoking Tobacco Use: Every Day    Smokeless Tobacco Use: Never    Passive Exposure: Not on file  Financial Resource Strain: Not on file  Food Insecurity: Not on file  Transportation Needs: Not on file  Physical Activity: Not on file  Stress: Not on file  Social Connections: Unknown (04/24/2022)   Received from [facility]   Social Network    Social Network: Not on file  Depression (PHQ2-9): Not on file  Alcohol Screen: Not on file  Housing: Not on file  Utilities: Not on file  Health Literacy: Not on file    Family History  Problem Relation Age of Onset   Diabetes Mother    Osteoporosis Father    Ulcers Father    Prostate cancer Father    Diabetes Brother    Diabetes Brother    Cancer Maternal Grandfather    Colon cancer Neg Hx     Outpatient Encounter Medications as of 02/26/2024  Medication Sig   acetaminophen  (TYLENOL ) 325 MG tablet Take by mouth.   atorvastatin (LIPITOR) 20 MG tablet Take 20 mg by mouth every morning.   fluticasone (FLONASE) 50 MCG/ACT nasal spray Place 1 spray into both nostrils daily.   gabapentin (NEURONTIN) 800 MG tablet Take 800 mg by mouth 3 (three) times daily.   Insulin Pen Needle (PEN NEEDLES) 31G X 6 MM MISC Use to inject insulin 4 times daily   lisinopril (PRINIVIL,ZESTRIL) 5 MG tablet Take 5 mg by mouth every morning.   Multiple  Vitamins-Minerals (PRESERVISION AREDS 2+MULTI VIT PO) TAKE (1) TABLET BY MOUTHATWICE DAILY.   XARELTO  20 MG TABS tablet TAKE 1 TABLET DAILY WITH SUPPER.   [DISCONTINUED] glimepiride  (AMARYL ) 4 MG tablet Take 1 tablet (4 mg total) by mouth daily with breakfast.   [DISCONTINUED] LANTUS  SOLOSTAR 100 UNIT/ML Solostar Pen Inject 38 Units into the skin at bedtime.   [DISCONTINUED] NOVOLOG  FLEXPEN 100 UNIT/ML FlexPen Inject 5-11 Units into the skin 3 (three) times daily with meals.   LANTUS  SOLOSTAR 100 UNIT/ML Solostar Pen Inject 35 Units into the skin at bedtime.   NOVOLOG  FLEXPEN 100 UNIT/ML FlexPen Inject 5-11 Units into the skin 3 (three) times daily with meals.   No facility-administered encounter medications on file as of 02/26/2024.    ALLERGIES: No Known Allergies  VACCINATION STATUS:  There is no immunization history on file for this patient.  Diabetes He presents for his follow-up diabetic visit. He has type 2 diabetes mellitus. Onset time: diagnosed at approx age of 31. His disease course has been improving. Hypoglycemia symptoms include sweats and tremors. Associated symptoms include fatigue, foot paresthesias, polydipsia and polyuria. There are no hypoglycemic complications. Symptoms are improving. Diabetic complications include heart disease (Afib), nephropathy and peripheral neuropathy. Risk factors for coronary artery disease include diabetes mellitus, dyslipidemia, family history, male sex, obesity, hypertension, tobacco exposure and sedentary lifestyle. Current diabetic treatment includes oral agent (monotherapy) and intensive insulin program. He is compliant with treatment most of the time. His weight is fluctuating minimally. He is following a generally healthy diet. When asked about meal planning, he reported none. He has not had a previous visit with a dietitian. He participates in exercise intermittently. His home blood glucose trend is decreasing steadily. (He presents today,  with his CGM showing improving, mostly at goal glycemic profile overall.  His POCT A1c today is 9.4%, increasing from last visit of 8.6%.  Analysis of his CGM shows TIR 55%, TAR 45%, TBR 0% with a GMI of 7.5%.  He has been working hard on his diet, has been walking more recently.) An ACE inhibitor/angiotensin II receptor blocker is being taken. He sees a podiatrist.Eye exam is current.     Review of systems  Constitutional: + decreasing  body weight, current Body mass index is 28.65 kg/m., no fatigue, no subjective hyperthermia, no subjective hypothermia Eyes: no blurry vision, no xerophthalmia ENT: no sore throat, no nodules palpated in throat, no dysphagia/odynophagia, no hoarseness Cardiovascular: no chest pain, no shortness of breath, no palpitations, no leg swelling Respiratory: + chronic cough- Hx COPD, no shortness of breath Gastrointestinal: no nausea/vomiting/diarrhea Musculoskeletal: no muscle/joint aches Skin: no rashes, no hyperemia Neurological: no tremors, + numbness/ tingling to BLE, no dizziness Psychiatric: no depression, no anxiety  Objective:     BP 112/74 (BP Location: Left Arm, Patient Position: Sitting, Cuff Size: Large)   Pulse 83   Ht 5' 8 (1.727 m)   Wt 188 lb 6.4 oz (85.5 kg)   BMI 28.65 kg/m   Wt Readings from Last 3 Encounters:  02/26/24 188 lb 6.4 oz (85.5 kg)  01/15/24 196 lb 3.2 oz (89 kg)  08/21/23 196 lb 3.2 oz (89 kg)     BP Readings from Last 3 Encounters:  02/26/24 112/74  08/21/23 110/74  05/08/23 108/74     Physical Exam- Limited  Constitutional:  Body mass index is 28.65 kg/m. , not in acute distress, normal state of mind Eyes:  EOMI, no exophthalmos Musculoskeletal: no gross deformities, strength intact in all four extremities, no gross restriction of joint movements Skin:  no rashes, no hyperemia Neurological: no tremor with outstretched hands   Diabetic Foot Exam - Simple   No data filed      CMP ( most recent) CMP      Component Value Date/Time   NA 138 02/12/2024 0907   K 4.4 02/12/2024 0907   CL 100 02/12/2024 0907   CO2 25 02/12/2024 0907   GLUCOSE 181 (H) 02/12/2024 0907   GLUCOSE 214 (H) 08/27/2017 1105   BUN 17 02/12/2024 0907   CREATININE 0.95 02/12/2024 0907   CALCIUM 9.5 02/12/2024 0907   PROT 7.2 02/12/2024 0907   ALBUMIN 4.4 02/12/2024 0907   AST 17 02/12/2024 0907   ALT 20 02/12/2024 0907   ALKPHOS 85 02/12/2024 0907   BILITOT 1.2 02/12/2024 0907   EGFR 87 02/12/2024 0907   GFRNONAA >60 08/27/2017 1105     Diabetic Labs (most recent): Lab Results  Component Value Date   HGBA1C 9.4 (A) 02/26/2024   HGBA1C 8.6 (A) 08/21/2023   HGBA1C 8.3 (A) 05/08/2023     Lipid Panel ( most recent) Lipid Panel     Component Value Date/Time   CHOL 109 02/12/2024 0907   TRIG 72 02/12/2024 0907   HDL 38 (L) 02/12/2024 0907   CHOLHDL 2.9 02/12/2024 0907   LDLCALC 56 02/12/2024 0907   LABVLDL 15 02/12/2024 0907      Lab Results  Component Value Date   TSH 3.920 02/12/2024   TSH 1.92 12/20/2022   FREET4 0.98 02/12/2024           Assessment & Plan:   1) Type 2 diabetes mellitus with hyperglycemia, without long-term current use of insulin (HCC) (Primary)  He presents today, with his CGM showing improving, mostly at goal glycemic profile overall.  His POCT A1c today is 9.4%, increasing from last visit of 8.6%.  Analysis of his CGM shows TIR 55%, TAR 45%, TBR 0% with a GMI of 7.5%.  He has been working hard on his diet, has been walking more recently.  - Garrett Clements has currently uncontrolled symptomatic type 2 DM since 68 years of age.   -Recent labs reviewed.  - I had  a long discussion with him about the progressive nature of diabetes and the pathology behind its complications. -his diabetes is complicated by mild CKD, neuropathy, smoking, and hx of noncompliance and he remains at a high risk for more acute and chronic complications which include CAD, CVA, CKD, retinopathy,  and neuropathy. These are all discussed in detail with him.  The following Lifestyle Medicine recommendations according to American College of Lifestyle Medicine [facility]) were discussed and offered to patient and he agrees to start the journey:  A. Whole Foods, Plant-based plate comprising of fruits and vegetables, plant-based proteins, whole-grain carbohydrates was discussed in detail with the patient.   A list for source of those nutrients were also provided to the patient.  Patient will use only water  or unsweetened tea for hydration. B.  The need to stay away from risky substances including alcohol, smoking; obtaining 7 to 9 hours of restorative sleep, at least 150 minutes of moderate intensity exercise weekly, the importance of healthy social connections,  and stress reduction techniques were discussed. C.  A full color page of  Calorie density of various food groups per pound showing examples of each food groups was provided to the patient.  - Nutritional counseling repeated/built upon at each appointment.  - The patient admits there is a room for improvement in their diet and drink choices. -  Suggestion is made for the patient to avoid simple carbohydrates from their diet including Cakes, Sweet Desserts / Pastries, Ice Cream, Soda (diet and regular), Sweet Tea, Candies, Chips, Cookies, Sweet Pastries, Store Bought Juices, Alcohol in Excess of 1-2 drinks a day, Artificial Sweeteners, Coffee Creamer, and Sugar-free Products. This will help patient to have stable blood glucose profile and potentially avoid unintended weight gain.   - I encouraged the patient to switch to unprocessed or minimally processed complex starch and increased protein intake (animal or plant source), fruits, and vegetables.   - Patient is advised to stick to a routine mealtimes to eat 3 meals a day and avoid unnecessary snacks (to snack only to correct hypoglycemia).  - I have approached him with the following  individualized plan to manage his diabetes and patient agrees:   -He is advised to lower Lantus  to 35 units SQ nightly and continue Novolog  5-11 units TID with meals if glucose is above 90 and he is eating (Specific instructions on how to titrate insulin dosage based on glucose readings given to patient in writing).  I did stop his Glimepiride  today as I suspect his glucose drops too fast after eating at times due to this medication.  -he is encouraged to continue monitoring glucose 2 times daily (using CGM), before breakfast and before bed, and to call the clinic if he has readings less than 70 or above 300 for 3 tests in a row.  - Adjustment parameters are given to him for hypo and hyperglycemia in writing.  - he is not an ideal candidate for incretin therapy given his history of heavy smoking, which would increase his risk of pancreatitis on GLP1 hormone.  - Specific targets for  A1c; LDL, HDL, and Triglycerides were discussed with the patient.  2) Blood Pressure /Hypertension:  his blood pressure is controlled to target.   he is advised to continue his current medications as prescribed by his PCP.  3) Lipids/Hyperlipidemia:    Review of his recent lipid panel from 02/12/24 showed controlled LDL at 56 .  he is advised to continue Atorvastatin 20 mg daily at bedtime.  Side effects and precautions discussed with him.    4)  Weight/Diet:  his Body mass index is 28.65 kg/m.  -  clearly complicating his diabetes care.   he is a candidate for weight loss. I discussed with him the fact that loss of 5 - 10% of his  current body weight will have the most impact on his diabetes management.  Exercise, and detailed carbohydrates information provided  -  detailed on discharge instructions.  5) Chronic Care/Health Maintenance: -he is on ACEI/ARB and Statin medications and is encouraged to initiate and continue to follow up with Ophthalmology, Dentist, Podiatrist at least yearly or according to  recommendations, and advised to QUIT SMOKING. I have recommended yearly flu vaccine and pneumonia vaccine at least every 5 years; moderate intensity exercise for up to 150 minutes weekly; and sleep for at least 7 hours a day.  - he is advised to maintain close follow up with Patient, No Pcp Per for primary care needs, as well as his other providers for optimal and coordinated care.     I spent  38  minutes in the care of the patient today including review of labs from CMP, Lipids, Thyroid  Function, Hematology (current and previous including abstractions from other facilities); face-to-face time discussing  his blood glucose readings/logs, discussing hypoglycemia and hyperglycemia episodes and symptoms, medications doses, his options of short and long term treatment based on the latest standards of care / guidelines;  discussion about incorporating lifestyle medicine;  and documenting the encounter. Risk reduction counseling performed per USPSTF guidelines to reduce obesity and cardiovascular risk factors.     Please refer to Patient Instructions for Blood Glucose Monitoring and Insulin/Medications Dosing Guide  in media tab for additional information. Please  also refer to  Patient Self Inventory in the Media  tab for reviewed elements of pertinent patient history.  Garrett Clements participated in the discussions, expressed understanding, and voiced agreement with the above plans.  All questions were answered to his satisfaction. he is encouraged to contact clinic should he have any questions or concerns prior to his return visit.     Follow up plan: - Return in about 4 months (around 06/25/2024) for Diabetes F/U with A1c in office, No previsit labs, Bring meter and logs.   Benton Rio, [facility] [address] [place], [place] [address] Phone: [phone] Fax: [phone]  02/26/2024, 11:26 AM    "

## 2024-03-18 ENCOUNTER — Ambulatory Visit (INDEPENDENT_AMBULATORY_CARE_PROVIDER_SITE_OTHER)

## 2024-03-18 ENCOUNTER — Ambulatory Visit

## 2024-03-18 DIAGNOSIS — E1142 Type 2 diabetes mellitus with diabetic polyneuropathy: Secondary | ICD-10-CM | POA: Diagnosis not present

## 2024-03-18 DIAGNOSIS — S9032XA Contusion of left foot, initial encounter: Secondary | ICD-10-CM | POA: Diagnosis not present

## 2024-03-18 DIAGNOSIS — S93402A Sprain of unspecified ligament of left ankle, initial encounter: Secondary | ICD-10-CM | POA: Diagnosis not present

## 2024-03-18 DIAGNOSIS — M79671 Pain in right foot: Secondary | ICD-10-CM | POA: Diagnosis not present

## 2024-03-18 DIAGNOSIS — M79672 Pain in left foot: Secondary | ICD-10-CM | POA: Diagnosis not present

## 2024-03-18 NOTE — Progress Notes (Unsigned)
 "  Subjective:  Patient ID: Garrett Clements, male    DOB: Mar 03, 1955,  MRN: [id number]  Chief Complaint  Patient presents with   Ankle Pain     Left ankle pain he twisted it 2 days ago. He heard a small crack and it has been swollen since     Discussed the use of AI scribe software for clinical note transcription with the patient, who gave verbal consent to proceed.  History of Present Illness Garrett Clements is a 69 year old male with peripheral neuropathy who presents with left ankle pain and swelling following a fall.  On Monday he slipped on wet ground, twisted his left ankle, and fell, with immediate severe pain and swelling localized to the medial ankle and foot. Pain is intense and nearly unbearable with weight-bearing or pressure over the area, without pain laterally or in the proximal leg.  He has burning and tingling in the region consistent with his neuropathy. Pain is worsened by ambulation and exercise, especially with weight on his toes, and he feels it is deep in the bone. He has no pain at rest.  He notes decreased plantar padding bilaterally that makes walking more uncomfortable. He continues to walk and exercise but feels unstable with activities like taking out the trash and has had near-falls since the injury. He wears loose boots and sometimes uses styrofoam padding for comfort.     Review of Systems: Negative except as noted in the HPI. Denies N/V/F/Ch.  Past Medical History:  Diagnosis Date   Anxiety    Aortic atherosclerosis    Chronic atrial fibrillation [facility])    Diagnosed February 2016   Chronic back pain    Coronary artery calcification    Depression    Essential hypertension    GERD (gastroesophageal reflux disease)    H. pylori infection 07/2011   Hiatal hernia    Leukocytosis    Memory deficits    Migraines    Neuropathy    Sleep apnea    CPAP prescibed but I don't use   Tubulovillous adenoma of colon 05/23/10   Type 2 diabetes mellitus (HCC)     Current Medications[1]  Tobacco Use History[2]  Allergies[3] Objective:  {[id number]}   Radiographs: Taken and reviewed. ***   Physical Exam MUSCULOSKELETAL: Pain on palpation of the left foot. Strength intact in the left foot tendon. Tension present in the left foot.   No images are attached to the encounter.   Assessment:   1. Sprain of left ankle, unspecified ligament, initial encounter      Plan:  Patient was evaluated and treated and all questions answered.  Assessment and Plan Assessment & Plan Left ankle sprain Acute left ankle sprain with medial pain and swelling. No fracture on initial evaluation. Conservative management preferred due to high surgical risk from diabetes and elevated A1c. - Ordered additional ankle radiographs to rule out fracture. - Applied walking boot for 3-4 weeks. - Advised normal activity with boot, avoiding unstable surfaces and hunting. - Provided Even Up device instructions for balance with cam walker. - Advised to remain on flat ground and avoid snow. - Scheduled follow-up in 3-4 weeks. - Discussed physical therapy for gait and balance post-acute pain.  Fat pad atrophy of foot Chronic fat pad atrophy causing increased pain with weight bearing due to reduced cushioning. Non-surgical management preferred due to comorbidities and surgical risk. - Applied shoe padding to offload pressure. - Provided extra pads for boots and footwear. - Demonstrated  pad placement to redistribute weight.      No follow-ups on file.   Prentice Ovens, DPM AACFAS Fellowship Trained Podiatric Surgeon [facility]     [1]  Current Outpatient Medications:    acetaminophen  (TYLENOL ) 325 MG tablet, Take by mouth., Disp: , Rfl:    atorvastatin (LIPITOR) 20 MG tablet, Take 20 mg by mouth every morning., Disp: , Rfl:    fluticasone (FLONASE) 50 MCG/ACT nasal spray, Place 1 spray into both nostrils daily., Disp: , Rfl:    gabapentin  (NEURONTIN) 800 MG tablet, Take 800 mg by mouth 3 (three) times daily., Disp: , Rfl:    Insulin Pen Needle (PEN NEEDLES) 31G X 6 MM MISC, Use to inject insulin 4 times daily, Disp: 100 each, Rfl: 6   LANTUS  SOLOSTAR 100 UNIT/ML Solostar Pen, Inject 35 Units into the skin at bedtime., Disp: 36 mL, Rfl: 3   lisinopril (PRINIVIL,ZESTRIL) 5 MG tablet, Take 5 mg by mouth every morning., Disp: , Rfl:    Multiple Vitamins-Minerals (PRESERVISION AREDS 2+MULTI VIT PO), TAKE (1) TABLET BY MOUTHATWICE DAILY., Disp: , Rfl:    NOVOLOG  FLEXPEN 100 UNIT/ML FlexPen, Inject 5-11 Units into the skin 3 (three) times daily with meals., Disp: 45 mL, Rfl: 3   XARELTO  20 MG TABS tablet, TAKE 1 TABLET DAILY WITH SUPPER., Disp: 30 tablet, Rfl: 6 [2]  Social History Tobacco Use  Smoking Status Every Day   Current packs/day: 2.00   Average packs/day: 2.0 packs/day for 55.8 years (111.7 ttl pk-yrs)   Types: Cigarettes   Start date: 05/19/1968  Smokeless Tobacco Never  [3] No Known Allergies  "

## 2024-04-08 ENCOUNTER — Ambulatory Visit

## 2024-04-15 ENCOUNTER — Ambulatory Visit: Admitting: Podiatry

## 2024-06-25 ENCOUNTER — Ambulatory Visit: Admitting: Nurse Practitioner
